# Patient Record
Sex: Female | Born: 1935 | Race: Black or African American | Hispanic: No | Smoking: Never smoker
Health system: Southern US, Community
[De-identification: ages and names within clinical notes are randomized; demographics above are authoritative.]

## PROBLEM LIST (undated history)

## (undated) DIAGNOSIS — M8580 Other specified disorders of bone density and structure, unspecified site: Secondary | ICD-10-CM

## (undated) DIAGNOSIS — M7061 Trochanteric bursitis, right hip: Secondary | ICD-10-CM

## (undated) DIAGNOSIS — E041 Nontoxic single thyroid nodule: Secondary | ICD-10-CM

## (undated) DIAGNOSIS — I5020 Unspecified systolic (congestive) heart failure: Secondary | ICD-10-CM

## (undated) DIAGNOSIS — I5189 Other ill-defined heart diseases: Secondary | ICD-10-CM

## (undated) DIAGNOSIS — M329 Systemic lupus erythematosus, unspecified: Secondary | ICD-10-CM

## (undated) DIAGNOSIS — M1710 Unilateral primary osteoarthritis, unspecified knee: Secondary | ICD-10-CM

## (undated) DIAGNOSIS — I272 Pulmonary hypertension, unspecified: Secondary | ICD-10-CM

## (undated) DIAGNOSIS — E538 Deficiency of other specified B group vitamins: Secondary | ICD-10-CM

## (undated) DIAGNOSIS — E559 Vitamin D deficiency, unspecified: Secondary | ICD-10-CM

## (undated) DIAGNOSIS — K649 Unspecified hemorrhoids: Secondary | ICD-10-CM

## (undated) DIAGNOSIS — I2510 Atherosclerotic heart disease of native coronary artery without angina pectoris: Secondary | ICD-10-CM

## (undated) DIAGNOSIS — M1990 Unspecified osteoarthritis, unspecified site: Secondary | ICD-10-CM

## (undated) DIAGNOSIS — E876 Hypokalemia: Secondary | ICD-10-CM

## (undated) DIAGNOSIS — E785 Hyperlipidemia, unspecified: Secondary | ICD-10-CM

## (undated) DIAGNOSIS — D649 Anemia, unspecified: Secondary | ICD-10-CM

## (undated) DIAGNOSIS — M179 Osteoarthritis of knee, unspecified: Secondary | ICD-10-CM

## (undated) DIAGNOSIS — I10 Essential (primary) hypertension: Secondary | ICD-10-CM

## (undated) HISTORY — DX: Hyperlipidemia, unspecified: E78.5

## (undated) HISTORY — PX: COLONOSCOPY: SHX174

## (undated) HISTORY — DX: Essential (primary) hypertension: I10

## (undated) HISTORY — DX: Osteoarthritis of knee, unspecified: M17.9

## (undated) HISTORY — PX: TONSILLECTOMY: SUR1361

## (undated) HISTORY — PX: TUBAL LIGATION: SHX77

## (undated) HISTORY — DX: Unilateral primary osteoarthritis, unspecified knee: M17.10

---

## 2008-04-06 ENCOUNTER — Encounter: Payer: Self-pay | Admitting: Family Medicine

## 2009-09-24 ENCOUNTER — Encounter: Admission: RE | Admit: 2009-09-24 | Discharge: 2009-09-24 | Payer: Self-pay | Admitting: Family Medicine

## 2009-09-24 ENCOUNTER — Ambulatory Visit: Payer: Self-pay | Admitting: Family Medicine

## 2009-09-24 DIAGNOSIS — I10 Essential (primary) hypertension: Secondary | ICD-10-CM | POA: Insufficient documentation

## 2009-09-24 DIAGNOSIS — M1710 Unilateral primary osteoarthritis, unspecified knee: Secondary | ICD-10-CM

## 2009-09-24 DIAGNOSIS — E119 Type 2 diabetes mellitus without complications: Secondary | ICD-10-CM | POA: Insufficient documentation

## 2009-11-07 ENCOUNTER — Ambulatory Visit: Payer: Self-pay | Admitting: Family Medicine

## 2009-11-07 LAB — CONVERTED CEMR LAB: Hgb A1c MFr Bld: 6 %

## 2009-11-13 ENCOUNTER — Telehealth (INDEPENDENT_AMBULATORY_CARE_PROVIDER_SITE_OTHER): Payer: Self-pay | Admitting: *Deleted

## 2009-11-28 ENCOUNTER — Ambulatory Visit: Payer: Self-pay | Admitting: Family Medicine

## 2009-12-20 ENCOUNTER — Ambulatory Visit: Payer: Self-pay | Admitting: Family Medicine

## 2010-01-14 ENCOUNTER — Encounter: Payer: Self-pay | Admitting: Family Medicine

## 2010-01-15 LAB — CONVERTED CEMR LAB
ALT: 15 units/L (ref 0–35)
Alkaline Phosphatase: 67 units/L (ref 39–117)
CO2: 30 meq/L (ref 19–32)
Cholesterol: 238 mg/dL — ABNORMAL HIGH (ref 0–200)
Creatinine, Ser: 0.68 mg/dL (ref 0.40–1.20)
LDL Cholesterol: 154 mg/dL — ABNORMAL HIGH (ref 0–99)
Sodium: 140 meq/L (ref 135–145)
Total Bilirubin: 0.9 mg/dL (ref 0.3–1.2)
Total CHOL/HDL Ratio: 3.6
Total Protein: 6.9 g/dL (ref 6.0–8.3)
VLDL: 17 mg/dL (ref 0–40)

## 2010-03-06 ENCOUNTER — Ambulatory Visit: Payer: Self-pay | Admitting: Family Medicine

## 2010-03-06 DIAGNOSIS — E785 Hyperlipidemia, unspecified: Secondary | ICD-10-CM

## 2010-05-22 ENCOUNTER — Encounter: Payer: Self-pay | Admitting: Family Medicine

## 2010-05-23 LAB — CONVERTED CEMR LAB
Creatinine, Urine: 107.2 mg/dL
HDL: 71 mg/dL (ref >39)
Hgb A1c MFr Bld: 6 % — ABNORMAL HIGH (ref <5.7)
LDL Cholesterol: 139 mg/dL — ABNORMAL HIGH (ref 0–99)
Microalb Creat Ratio: 15.4 mg/g (ref 0.0–30.0)
Total CHOL/HDL Ratio: 3.2

## 2010-08-13 ENCOUNTER — Ambulatory Visit: Payer: Self-pay | Admitting: Family Medicine

## 2010-08-13 ENCOUNTER — Telehealth: Payer: Self-pay | Admitting: Family Medicine

## 2010-08-13 DIAGNOSIS — E669 Obesity, unspecified: Secondary | ICD-10-CM

## 2010-09-05 ENCOUNTER — Telehealth (INDEPENDENT_AMBULATORY_CARE_PROVIDER_SITE_OTHER): Payer: Self-pay | Admitting: *Deleted

## 2010-11-05 NOTE — Assessment & Plan Note (Signed)
Summary: BP/DM   Vital Signs:  Patient profile:   75 year old female Height:      63 inches Weight:      196 pounds BMI:     34.85 Pulse rate:   74 / minute BP sitting:   136 / 74  Vitals Entered By: Kandice Hams (November 07, 2009 9:30 AM) CC: 6 wl followup   Primary Care Provider:  Seymour Bars DO  CC:  6 wl followup.  History of Present Illness: 75 yo AAF presents for DM f/u, HTN and DJD of the knees.  Her knee pain has improved with use of MSM and gin soaked raisins.  She declined ortho referral b/c she did not want joint injections.  She is still exercising.  Denies knee swelling.  Due for for A1C today.  Her AM fastings are running 110s.  She does not take any diabetes meds.  She is taking several BP mes including an ARB + ACEi that her last doctor put her on.  Denies CP, DOE, leg swelling.    Allergies: No Known Drug Allergies  Past History:  Past Medical History: Reviewed history from 09/24/2009 and no changes required. DM 2006 (off meds) HTN high chol (off meds) knee DJD  Past Surgical History: Reviewed history from 09/24/2009 and no changes required. tonsillectomy BTL  Social History: Reviewed history from 09/24/2009 and no changes required. 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 Moved from [facility] in 07-2009. Daughter lives here.  son in [place].  son in [place].  Has 7 grandkids. Never smoked.  Denies ETOH. Does exercise.  Review of Systems      See HPI  Physical Exam  General:  alert, well-developed, well-nourished, well-hydrated, and overweight-appearing.   Head:  normocephalic and atraumatic.   Eyes:  pupils equal, pupils round, and pupils reactive to light.   Nose:  no nasal discharge.   Mouth:  pharynx pink and moist.   Neck:  no masses.   Lungs:  Normal respiratory effort, chest expands symmetrically. Lungs are clear to auscultation, no crackles or wheezes. Heart:  Normal rate and regular rhythm. S1 and S2 normal without gallop,  murmur, click, rub or other extra sounds. Extremities:  trace bilat ankle edema, non pitting Skin:  color normal.   Psych:  good eye contact, not anxious appearing, and not depressed appearing.     Impression & Recommendations:  Problem # 1:  ESSENTIAL HYPERTENSION, BENIGN (ICD-401.1) Changed BP meds to just 1 tab of Tribenzor daily.  Repeat at a nurse visit in 3 wks.   The following medications were removed from the medication list:    Avapro 300 Mg Tabs (Irbesartan) .Marland Kitchen... 1 tab by mouth daily    Amlodipine Besylate 5 Mg Tabs (Amlodipine besylate) .Marland Kitchen... Take 1 tab by mouth once daily    Lisinopril 20 Mg Tabs (Lisinopril) .Marland Kitchen... 1 tab by mouth daily    Hydrochlorothiazide 25 Mg Tabs (Hydrochlorothiazide) .Marland Kitchen... 1 tab by mouth daily Her updated medication list for this problem includes:    Tribenzor 40-5-25 Mg Tabs (Olmesartan-amlodipine-hctz) .Marland Kitchen... 1 tab by mouth daily  BP today: 136/74 Prior BP: 134/72 (09/24/2009)  Problem # 2:  DM (ICD-250.00)  She is off meds for DM and has an A1C of 6 -- good.  Continue diet and exercise for treatment.   The following medications were removed from the medication list:    Avapro 300 Mg Tabs (Irbesartan) .Marland Kitchen... 1 tab by mouth daily    Lisinopril  20 Mg Tabs (Lisinopril) .Marland Kitchen... 1 tab by mouth daily Her updated medication list for this problem includes:    Tribenzor 40-5-25 Mg Tabs (Olmesartan-amlodipine-hctz) .Marland Kitchen... 1 tab by mouth daily  Orders: Fingerstick (36416) Hgb A1C (44010UV)  Problem # 3:  DEGENERATIVE JOINT DISEASE, KNEES, BILATERAL (ICD-715.96) Assessment: Improved Continue current treatment plan. Her updated medication list for this problem includes:    Ibuprofen 800 Mg Tabs (Ibuprofen) .Marland Kitchen... Take 1 tab by mouth three times a day  as needed pain  Complete Medication List: 1)  Ibuprofen 800 Mg Tabs (Ibuprofen) .... Take 1 tab by mouth three times a day  as needed pain 2)  Tribenzor 40-5-25 Mg Tabs (Olmesartan-amlodipine-hctz) .Marland Kitchen.. 1  tab by mouth daily  Patient Instructions: 1)  Change BP meds to [facility] 1 tablet by mouth each morning. 2)  Keep working on [employer] and regular exercise. 3)  A1C looks great at 6.0. 4)  REturn for a nurse BP check in 3 wks. Prescriptions: TRIBENZOR 40-5-25 MG TABS (OLMESARTAN-AMLODIPINE-HCTZ) 1 tab by mouth daily  #90 x 0   Entered and Authorized by:   Seymour Bars DO   Signed by:   Seymour Bars DO on 11/07/2009   Method used:   Print then Give to Patient   RxID:   [id number]   Laboratory Results   Blood Tests     HGBA1C: 6.0%   (Normal Range: Non-Diabetic - 3-6%   Control Diabetic - 6-8%)

## 2010-11-05 NOTE — Assessment & Plan Note (Signed)
Summary: f/u IFG/ HTN/ Chol   Vital Signs:  Patient profile:   75 year old female Height:      63 inches Weight:      195 pounds BMI:     34.67 O2 Sat:      99 % on Room air Pulse rate:   66 / minute BP sitting:   150 / 78  (left arm) Cuff size:   large  Vitals Entered By: Payton Spark CMA (August 13, 2010 9:07 AM)  O2 Flow:  Room air CC: F/U HTN and DM. Too early for A1C.   Primary Care Provider:  Seymour Bars DO  CC:  F/U HTN and DM. Too early for A1C.Theresa [name]  History of Present Illness: 75 yo AAF presents for f/u hyperlipidemia, HTN and DM.  She refused to take the Pravastatin because she is afraid and continues to refuse to take them.    She is on HCTZ, Avapro and Norvasc but her BPs even at home are still running 140s/80s.  She lost 5 lbs and has been exercising more and eating more fruits and veggies.  Her last A1C in Aug.  She is diet controlled.  Her AM fastings are running <100.    Denies CP, DOE, leg swelling or heart palptiations.      Allergies: No Known Drug Allergies  Past History:  Past Medical History: DM 2006 (off meds)-- vs IFG HTN high chol (off meds) knee DJD  Past Surgical History: Reviewed history from 09/24/2009 and no changes required. tonsillectomy BTL  Social History: Reviewed history from 09/24/2009 and no changes required. 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 Moved from [facility] in 07-[facility]. Daughter lives here.  son in [place].  son in [place].  Has 7 grandkids. Never smoked.  Denies ETOH. Does exercise.  Review of Systems      See HPI  Physical Exam  General:  alert, well-developed, well-nourished, and well-hydrated.  obese Head:  normocephalic and atraumatic.   Eyes:  pupils equal, pupils round, and pupils reactive to light.  wears glasses Mouth:  pharynx pink and moist.   Neck:  no masses.   Lungs:  Normal respiratory effort, chest expands symmetrically. Lungs are clear to auscultation, no crackles or  wheezes. Heart:  RRR with 1/6 systolic mumur Pulses:  2+ radial pulses Extremities:  trace ankle edema bilat, non pitting Skin:  color normal.   Cervical Nodes:  No lymphadenopathy noted Psych:  good eye contact, not anxious appearing, and not depressed appearing.     Impression & Recommendations:  Problem # 1:  ESSENTIAL HYPERTENSION, BENIGN (ICD-401.1) BP high today even with medication compliance.  Her home BP readings are even high with wt loss, healthier diet and more exercise.  I am going to add Metoprolol daily.  Call with home BP readings in 1 month or if any problems.  o/w f/u in 3 mos. Her updated medication list for this problem includes:    Norvasc 5 Mg Tabs (Amlodipine besylate) .Theresa [name]... 1 tab by mouth daily    Hydrochlorothiazide 25 Mg Tabs (Hydrochlorothiazide) .Theresa [name]... Take 1 tab by mouth once daily    Avapro 300 Mg Tabs (Irbesartan) .Theresa [name]... 1 tab by mouth daily    Metoprolol Succinate 50 Mg Xr24h-tab (Metoprolol succinate) .Theresa [name]... 1 tab by mouth daily  BP today: 150/78 Prior BP: 127/78 (03/06/2010)  Prior 10 Yr Risk Heart Disease: Not enough information (12/20/2009)  Labs Reviewed: K+: 3.4 (01/14/2010) Creat: : 0.68 (01/14/2010)   Chol: 226 (  05/22/2010)   HDL: 71 (05/22/2010)   LDL: 139 (05/22/2010)   TG: 78 (05/22/2010)  Problem # 2:  DM (ICD-250.00) IFG vs DM, diet controlled.  Last A1C 6 c/w IFG.  She can check AM fastings 2-3 x a wk and should continue to work on wt loss measures, low sugar / low carb diet.  Plan to do 2 hr OGTT in 2012. Her updated medication list for this problem includes:    Avapro 300 Mg Tabs (Irbesartan) .Theresa [name]... 1 tab by mouth daily  Problem # 3:  HYPERLIPIDEMIA (ICD-272.4) Pt refused to take statin (never did try it).  I discussed her risk for CVD from not taking it and she still refused to take it.  Her LDL goal is <100.  She is going to up her Fish Oil to 4 g/ day and keep working on wt loss.  Plan to recheck in 6 mos. Labs Reviewed: SGOT: 17  (01/14/2010)   SGPT: 15 (01/14/2010)  Prior 10 Yr Risk Heart Disease: Not enough information (12/20/2009)   HDL:71 (05/22/2010), 67 (01/14/2010)  LDL:139 (05/22/2010), 154 (01/14/2010)  Chol:226 (05/22/2010), 238 (01/14/2010)  Trig:78 (05/22/2010), 87 (01/14/2010)  Problem # 4:  OBESITY, UNSPECIFIED (ICD-278.00) BMI 34 c/w class I obesity.  She has lost 5 lbs which is a good start.  We discussed goal for diet and exercise.    Complete Medication List: 1)  Ibuprofen 800 Mg Tabs (Ibuprofen) .... Take 1 tab by mouth three times a day  as needed pain 2)  Norvasc 5 Mg Tabs (Amlodipine besylate) .Theresa [name].. 1 tab by mouth daily 3)  Hydrochlorothiazide 25 Mg Tabs (Hydrochlorothiazide) .... Take 1 tab by mouth once daily 4)  Avapro 300 Mg Tabs (Irbesartan) .Theresa [name].. 1 tab by mouth daily 5)  Metoprolol Succinate 50 Mg Xr24h-tab (Metoprolol succinate) .Theresa [name].. 1 tab by mouth daily 6)  One Touch Ultra Test Strips  .... Use only daily as directed  Patient Instructions: 1)  Add Metoprolol once daily for high BP. 2)  Monitor at home. 3)  Goal is <140/90. 4)  Add 4 grams of Omega 3 Fish Oil daily for lipids. 5)  Continue regular exercise, healthy - low sugar/ low carb diet. 6)  REturn for a PHYSICAL in 3 months.   Prescriptions: ONE TOUCH ULTRA TEST STRIPS use only daily as directed  #2 boxes x 3   Entered and Authorized by:   Seymour Bars DO   Signed by:   Seymour Bars DO on 08/13/2010   Method used:   Printed then faxed to ...       [facility] (mail-order)             , [place]         Ph: [phone]       Fax: [phone]   RxID:   [id number] METOPROLOL SUCCINATE 50 MG XR24H-TAB (METOPROLOL SUCCINATE) 1 tab by mouth daily  #90 x 1   Entered and Authorized by:   Seymour Bars DO   Signed by:   Seymour Bars DO on 08/13/2010   Method used:   Printed then faxed to ...       [facility] (mail-order)             , [place]         Ph: [phone]       Fax: [phone]   RxID:    [id number] METOPROLOL SUCCINATE 50 MG XR24H-TAB (METOPROLOL SUCCINATE) 1 tab by mouth daily  #30 x 0  Entered and Authorized by:   Seymour Bars DO   Signed by:   Seymour Bars DO on 08/13/2010   Method used:   Electronically to        [facility] (retail)       [address]       [place], [place]  [address]       Ph: [phone]       Fax: [phone]   RxID:   [id number]    Orders Added: 1)  Est. Patient Level IV [29518]

## 2010-11-05 NOTE — Assessment & Plan Note (Signed)
Summary: f/u BP/ labs   Vital Signs:  Patient profile:   75 year old female Height:      63 inches Weight:      200 pounds BMI:     35.56 O2 Sat:      97 % on Room air Pulse rate:   84 / minute BP sitting:   127 / 78  (left arm) Cuff size:   large  Vitals Entered By: Payton Spark CMA (March 06, 2010 1:29 PM)  O2 Flow:  Room air CC: F/U HTN. Too early for A1C   Primary Care Provider:  Seymour Bars DO  CC:  F/U HTN. Too early for A1C.  History of Present Illness: 75 yo AAF presents for f/u HTN.  She is doing well on HCTZ, Norvasc and Avapro.  Doing well on these.  She is taking an OTC K+ supplement daily.  She has been working on [employer] and more exercise.  Her last LDL was high in April but she wanted to try Omega 3 Fish oil and diet/ exercise.  She is not taking anything for her blood sugar.  She is not due for her A1C yet.    Denies CP or DOE.    Current Medications (verified): 1)  Ibuprofen 800 Mg Tabs (Ibuprofen) .... Take 1 Tab By Mouth Three Times A Day  As Needed Pain 2)  Norvasc 5 Mg Tabs (Amlodipine Besylate) .Marland Kitchen.. 1 Tab By Mouth Daily 3)  Hydrochlorothiazide 25 Mg Tabs (Hydrochlorothiazide) .... Take 1 Tab By Mouth Once Daily 4)  Avapro 300 Mg Tabs (Irbesartan) .Marland Kitchen.. 1 Tab By Mouth Daily  Allergies (verified): No Known Drug Allergies  Past History:  Past Medical History: Reviewed history from 09/24/2009 and no changes required. DM 2006 (off meds) HTN high chol (off meds) knee DJD  Social History: Reviewed history from 09/24/2009 and no changes required. 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 Moved from [facility] in 07-2009. Daughter lives here.  son in [place].  son in [place].  Has 7 grandkids. Never smoked.  Denies ETOH. Does exercise.  Review of Systems      See HPI  Physical Exam  General:  obese AAF in NAD Head:  normocephalic and atraumatic.   Eyes:  pupils equal, pupils round, and pupils reactive to light.  wears glasses Nose:  no  nasal discharge.   Mouth:  pharynx pink and moist.   Neck:  no masses.   Lungs:  Normal respiratory effort, chest expands symmetrically. Lungs are clear to auscultation, no crackles or wheezes. Heart:  RRR with 1/6 systolic mumur Extremities:  no LE edema Skin:  color normal.   Cervical Nodes:  No lymphadenopathy noted Psych:  good eye contact, not anxious appearing, and not depressed appearing.     Impression & Recommendations:  Problem # 1:  ESSENTIAL HYPERTENSION, BENIGN (ICD-401.1) BP at goal on current meds.  Her updated medication list for this problem includes:    Norvasc 5 Mg Tabs (Amlodipine besylate) .Marland Kitchen... 1 tab by mouth daily    Hydrochlorothiazide 25 Mg Tabs (Hydrochlorothiazide) .Marland Kitchen... Take 1 tab by mouth once daily    Avapro 300 Mg Tabs (Irbesartan) .Marland Kitchen... 1 tab by mouth daily  BP today: 127/78 Prior BP: 136/73 (12/20/2009)  Prior 10 Yr Risk Heart Disease: Not enough information (12/20/2009)  Labs Reviewed: K+: 3.4 (01/14/2010) Creat: : 0.68 (01/14/2010)   Chol: 238 (01/14/2010)   HDL: 67 (01/14/2010)   LDL: 154 (01/14/2010)   TG: 87 (  01/14/2010)  Problem # 2:  HYPERLIPIDEMIA (ICD-272.4) Update FLP in 8 wks after her trial of diet, exercise, fish oil.  I have c/w her that her LDL goal is <100 and is will be close to impossible for her to get to goal w/o a statin.  She refuses this and understands the risk including death of non adherence. Orders: T-Lipid Profile [phone])  Labs Reviewed: SGOT: 17 (01/14/2010)   SGPT: 15 (01/14/2010)  Prior 10 Yr Risk Heart Disease: Not enough information (12/20/2009)   HDL:67 (01/14/2010)  LDL:154 (01/14/2010)  Chol:238 (01/14/2010)  Trig:87 (01/14/2010)  Problem # 3:  DM (ICD-250.00) She likely has more IFG than T2DM.  Will get her A1C (off meds) in 2 mos.  She still needs to work on healthy diet, exercise and wt loss. Her updated medication list for this problem includes:    Avapro 300 Mg Tabs (Irbesartan) .Marland Kitchen... 1 tab by  mouth daily  Orders: T-Hemoglobin A1C (09811) T-Urine Microalbumin w/creat. ratio [id number])  Complete Medication List: 1)  Ibuprofen 800 Mg Tabs (Ibuprofen) .... Take 1 tab by mouth three times a day  as needed pain 2)  Norvasc 5 Mg Tabs (Amlodipine besylate) .Marland Kitchen.. 1 tab by mouth daily 3)  Hydrochlorothiazide 25 Mg Tabs (Hydrochlorothiazide) .... Take 1 tab by mouth once daily 4)  Avapro 300 Mg Tabs (Irbesartan) .Marland Kitchen.. 1 tab by mouth daily  Patient Instructions: 1)  BP at goal! 2)  Update fasting labs in 2 mos. 3)  Will call you w/ results. 4)  Keep working on [employer], regular exercise, weight loss. 5)  Return for follow up office visit in 4 mos.

## 2010-11-05 NOTE — Assessment & Plan Note (Signed)
Summary: BP  Nurse Visit   Vital Signs:  Patient profile:   75 year old female O2 Sat:      100 % on Room air Pulse rate:   73 / minute BP sitting:   138 / 75  (right arm) Cuff size:   large  Vitals Entered By: Payton Spark CMA (November 28, 2009 10:25 AM)  O2 Flow:  Room air  Allergies: No Known Drug Allergies  Orders Added: 1)  Est. Patient Level I [47425]   CC: Follow-up HTN HPI: Taking meds? yes   Side effects? no Chest pain, SOB, Dizziness? no A/P: HTN (401.1) At goal? If no, Patient will be notified.  5 minutes was spent with patient.   Pt is unable to afford Tribenzor.    Impression & Recommendations:  Problem # 1:  ESSENTIAL HYPERTENSION, BENIGN (ICD-401.1) SBP goal is <130.  Cost an issue:  I would advise pt to have the pharmacy run the savings card as 'self pay' and it should cost her 25 dollars a month.   Her updated medication list for this problem includes:    Tribenzor 40-5-25 Mg Tabs (Olmesartan-amlodipine-hctz) .Marland Kitchen... 1 tab by mouth daily  BP today: 138/75 Prior BP: 136/74 (11/07/2009)  Complete Medication List: 1)  Ibuprofen 800 Mg Tabs (Ibuprofen) .... Take 1 tab by mouth three times a day  as needed pain 2)  Tribenzor 40-5-25 Mg Tabs (Olmesartan-amlodipine-hctz) .Marland Kitchen.. 1 tab by mouth daily   Appended Document: BP pt aware. Pt states that she isn't even able to activate the card bc she has medicare.   Appended Document: BP Pls contact our Tribenzor rep and find out if medicare pts can use it under 'self pay'.  Seymour Bars, D.O.  Appended Document: BP Viviann Spare w/ Tribenzor said that no savings card will work for [employer] w/ medicare part D. Pt not able to use as self pay w/out affecting medicare.   Appended Document: BP I am going to change her to generic norvasc (amlodopine) 5 mg/ day + Lisinopril/HCTZ 20/25 mg / day.   Once off Tribenzor. Return for a nurse BP check after on this regimen for 1 wk.  This should be cheaper.  Seymour Bars,  D.O.  Appended Document: BP LMOM for Pt to CB.Arvilla Market CMA, Michelle November 29, 2009 1:29 PM   pt aware of the above.Arvilla Market CMA, Michelle November 29, 2009 4:30 PM

## 2010-11-05 NOTE — Progress Notes (Signed)
Summary: Med supplies  Phone Note Call from Patient   Summary of Call: Pt LMOM stating we should be getting a fax or call from [employer] for supplies. Pt requested this and would like [place] to reply to them.  Initial call taken by: Payton Spark CMA,  November 13, 2009 4:51 PM

## 2010-11-05 NOTE — Progress Notes (Signed)
Summary: BP med question  Phone Note Call from Patient Call back at Home Phone [phone]   Caller: Patient Call For: Theresa Bars DO Summary of Call: Pt seen today and started her on Metoprolol today- pt wanted to know if could increase the Avapro to 300mg  because she was only taking 1/2 of the 300mg  Avapro rather than adding another medicine. Please advise Initial call taken by: Kathlene November LPN,  August 13, 2010 4:09 PM  Follow-up for Phone Call        this is why it is important to be honest with RX's during office visits.  Have her take a FULL AVAPRO 300 mg daily instead of starting the metoprolol and return for a nurse BP check in 2 wks.  Follow-up by: Theresa Bars DO,  August 13, 2010 4:18 PM     Appended Document: BP med question 08/13/2010 @ 4:19pm- Pt notified of MD instructions. KJ LPN

## 2010-11-05 NOTE — Assessment & Plan Note (Signed)
Summary: f/u HTN   Vital Signs:  Patient profile:   75 year old female Height:      63 inches Weight:      198 pounds BMI:     35.20 O2 Sat:      100 % on Room air Pulse rate:   80 / minute BP sitting:   136 / 73  (left arm) Cuff size:   large  Vitals Entered By: Payton Spark CMA (December 20, 2009 1:09 PM)  O2 Flow:  Room air CC: F/U BP, Hypertension Management   CC:  F/U BP and Hypertension Management.  Hypertension History:      She denies headache, chest pain, palpitations, dyspnea with exertion, peripheral edema, visual symptoms, and side effects from treatment.  She notes no problems with any antihypertensive medication side effects.        Positive major cardiovascular risk factors include female age 47 years old or older, diabetes, and hypertension.     Current Medications (verified): 1)  Ibuprofen 800 Mg Tabs (Ibuprofen) .... Take 1 Tab By Mouth Three Times A Day  As Needed Pain 2)  Norvasc 5 Mg Tabs (Amlodipine Besylate) .Marland Kitchen.. 1 Tab By Mouth Daily 3)  Hydrochlorothiazide 25 Mg Tabs (Hydrochlorothiazide) .... Take 1 Tab By Mouth Once Daily 4)  Avapro 150 Mg Tabs (Irbesartan) .... Take 1 Tab By Mouth Once Daily  Allergies (verified): No Known Drug Allergies  Past History:  Past Medical History: Reviewed history from 09/24/2009 and no changes required. DM 2006 (off meds) HTN high chol (off meds) knee DJD  Past Surgical History: Reviewed history from 09/24/2009 and no changes required. tonsillectomy BTL  Social History: Reviewed history from 09/24/2009 and no changes required. 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 Moved from [facility] in 07-2009. Daughter lives here.  son in [place].  son in [place].  Has 7 grandkids. Never smoked.  Denies ETOH. Does exercise.  Review of Systems      See HPI  Physical Exam  General:  alert, well-developed, well-nourished, well-hydrated, and overweight-appearing.   Head:  normocephalic and atraumatic.   Eyes:   pupils equal, pupils round, and pupils reactive to light.  wears glasses Nose:  no nasal discharge.   Mouth:  good dentition and pharynx pink and moist.   Neck:  no masses.   Lungs:  Normal respiratory effort, chest expands symmetrically. Lungs are clear to auscultation, no crackles or wheezes. Heart:  RRR with 1/6 systolic mumur Extremities:  trace pitting LE edema bilat Skin:  color normal.   Psych:  good eye contact, not anxious appearing, and not depressed appearing.     Impression & Recommendations:  Problem # 1:  ESSENTIAL HYPERTENSION, BENIGN (ICD-401.1) BP improved on current regimen.  Continue.  Update fasting labs.  RTC in 3 mos. The following medications were removed from the medication list:    Lisinopril-hydrochlorothiazide 20-25 Mg Tabs (Lisinopril-hydrochlorothiazide) .Marland Kitchen... 1 tab by mouth qam Her updated medication list for this problem includes:    Norvasc 5 Mg Tabs (Amlodipine besylate) .Marland Kitchen... 1 tab by mouth daily    Hydrochlorothiazide 25 Mg Tabs (Hydrochlorothiazide) .Marland Kitchen... Take 1 tab by mouth once daily    Avapro 300 Mg Tabs (Irbesartan) .Marland Kitchen... 1 tab by mouth daily  Orders: T-Comprehensive Metabolic Panel ([address]14)  BP today: 136/73 Prior BP: 138/75 (11/28/2009)  10 Yr Risk Heart Disease: Not enough information  Problem # 2:  DM (ICD-250.00) Diet controlled.  F/U in 3 mos. The following medications were removed  from the medication list:    Lisinopril-hydrochlorothiazide 20-25 Mg Tabs (Lisinopril-hydrochlorothiazide) .Marland Kitchen... 1 tab by mouth qam Her updated medication list for this problem includes:    Avapro 300 Mg Tabs (Irbesartan) .Marland Kitchen... 1 tab by mouth daily  Reviewed HgBA1c results: 6.0 (11/07/2009)  Complete Medication List: 1)  Ibuprofen 800 Mg Tabs (Ibuprofen) .... Take 1 tab by mouth three times a day  as needed pain 2)  Norvasc 5 Mg Tabs (Amlodipine besylate) .Marland Kitchen.. 1 tab by mouth daily 3)  Hydrochlorothiazide 25 Mg Tabs (Hydrochlorothiazide) .... Take  1 tab by mouth once daily 4)  Avapro 300 Mg Tabs (Irbesartan) .Marland Kitchen.. 1 tab by mouth daily 5)  Amoxicillin 500 Mg Caps (Amoxicillin) .... 4 capsules by mouth x 1 dose 30 min prior to dental procedure  Other Orders: T-Lipid Profile ([id number])  Hypertension Assessment/Plan:      The patient's hypertensive risk group is category C: Target organ damage and/or diabetes.  Today's blood pressure is 136/73.  Her blood pressure goal is < 130/80.  Patient Instructions: 1)  Meds RFd. 2)  Have fasting labs updated. 3)  Make sure annual eye exam is up to date. 4)  Return for f/u diabetes/ HTN in 3 mos. Prescriptions: AMOXICILLIN 500 MG CAPS (AMOXICILLIN) 4 capsules by mouth x 1 dose 30 min prior to dental procedure  #4 capsules x 2   Entered and Authorized by:   Seymour Bars DO   Signed by:   Seymour Bars DO on 12/20/2009   Method used:   Electronically to        [facility] (retail)       [address]       [place], [place]  [address]       Ph: [phone]       Fax: [phone]   RxID:   [id number] HYDROCHLOROTHIAZIDE 25 MG TABS (HYDROCHLOROTHIAZIDE) Take 1 tab by mouth once daily  #90 x 1   Entered and Authorized by:   Seymour Bars DO   Signed by:   Seymour Bars DO on 12/20/2009   Method used:   Print then Give to Patient   RxID:   [id number] AVAPRO 300 MG TABS (IRBESARTAN) 1 tab by mouth daily  #90 x 1   Entered and Authorized by:   Seymour Bars DO   Signed by:   Seymour Bars DO on 12/20/2009   Method used:   Print then Give to Patient   RxID:   [id number] NORVASC 5 MG TABS (AMLODIPINE BESYLATE) 1 tab by mouth daily  #90 x 1   Entered and Authorized by:   Seymour Bars DO   Signed by:   Seymour Bars DO on 12/20/2009   Method used:   Print then Give to Patient   RxID:   [id number]

## 2010-11-05 NOTE — Progress Notes (Signed)
Summary: Rx for HCTZ  Phone Note Call from Patient Call back at Home Phone [phone]   Caller: Patient Call For: Seymour Bars DO Summary of Call: [facility] as yet to receive her refill on the HCTZ- [facility]. Needs [facility] sent [facility] Initial call taken by: Kathlene November LPN,  September 05, 2010 8:56 AM    Prescriptions: NORVASC 5 MG TABS (AMLODIPINE BESYLATE) 1 tab by mouth daily  #90 x 1   Entered by:   Payton Spark CMA   Authorized by:   Seymour Bars DO   Signed by:   Payton Spark CMA on 09/05/2010   Method used:   Faxed to ...       [facility] (mail[facility]order)       [address]       [place], [place]  [id number]       Ph: [phone]       Fax: [phone]   RxID:   [id number] HYDROCHLOROTHIAZIDE 25 MG TABS (HYDROCHLOROTHIAZIDE) Take 1 tab by mouth once daily  #90 x 1   Entered by:   Payton Spark CMA   Authorized by:   Seymour Bars DO   Signed by:   Payton Spark CMA on 09/05/2010   Method used:   Faxed to ...       [facility] (mail-order)       [address]       [place], [place]  [id number]       Ph: [phone]       Fax: [phone]   RxID:   [id number]

## 2010-11-13 ENCOUNTER — Encounter (INDEPENDENT_AMBULATORY_CARE_PROVIDER_SITE_OTHER): Payer: Medicare HMO | Admitting: Family Medicine

## 2010-11-13 ENCOUNTER — Encounter: Payer: Self-pay | Admitting: Family Medicine

## 2010-11-13 DIAGNOSIS — Z23 Encounter for immunization: Secondary | ICD-10-CM

## 2010-11-13 DIAGNOSIS — Z0000 Encounter for general adult medical examination without abnormal findings: Secondary | ICD-10-CM

## 2010-11-13 DIAGNOSIS — I10 Essential (primary) hypertension: Secondary | ICD-10-CM

## 2010-11-13 DIAGNOSIS — Z780 Asymptomatic menopausal state: Secondary | ICD-10-CM | POA: Insufficient documentation

## 2010-11-14 ENCOUNTER — Encounter (INDEPENDENT_AMBULATORY_CARE_PROVIDER_SITE_OTHER): Payer: Self-pay | Admitting: *Deleted

## 2010-11-14 ENCOUNTER — Encounter: Payer: Self-pay | Admitting: Family Medicine

## 2010-11-21 NOTE — Letter (Signed)
Summary: Primary Care Consult Scheduled Letter  [place] [facility]  [address]   [place], [place] [address]   Phone: [phone]  Fax: [phone]      11/14/2010 MRN: [id number]  [facility] [address] [place], [place]  [address]  [place]    Dear Ms. Dula,      We have scheduled an appointment for you.  At the recommendation of Dr.Ryen Rhames, we have scheduled a colonoscopy with [facility] on December 27, 2010. Please arrive at 8:30. We have also scheduled a nurse visit on December 11, 2010 at 1:00. Please take current medications and your insurance card to your appointment. Their address is [address] [place], [place], [place]. The office phone number is [phone].  If this appointment day and time is not convenient for you, please feel free to call the office of the doctor you are being referred to at the number listed above and reschedule the appointment.     It is important for you to keep your scheduled appointments. We are here to make sure you are given good patient care. If you have questions or you have made changes to your appointment, please notify us at  [phone], ask for [facility].    Thank you,  Patient Care Coordinator [place] [facility]

## 2010-11-21 NOTE — Letter (Signed)
Summary: Pre Visit Letter Revised  [facility]  [address] [place], [place] [address]   Phone: [phone]  Fax: [phone]        11/14/2010 MRN: [id number] [facility] [address] [place], [place]  [address]  [place]             Procedure Date:  December 25, 2010   dir col -Dr Nedra Hai to the Gastroenterology Division at [facility].    You are scheduled to see a nurse for your pre-procedure visit on December 11, 2010 at 1:00pm on the 3rd floor at [facility], [address]  We ask that you try to arrive at our office 15 minutes prior to your appointment time to allow for check-in.  Please take a minute to review the attached form.  If you answer "Yes" to one or more of the questions on the first page, we ask that you call the person listed at your earliest opportunity.  If you answer "No" to all of the questions, please complete the rest of the form and bring it to your appointment.    Your nurse visit will consist of discussing your medical and surgical history, your immediate family medical history, and your medications.   If you are unable to list all of your medications on the form, please bring the medication bottles to your appointment and we will list them.  We will need to be aware of both prescribed and over the counter drugs.  We will need to know exact dosage information as well.    Please be prepared to read and sign documents such as consent forms, a financial agreement, and acknowledgement forms.  If necessary, and with your consent, a friend or relative is welcome to sit-in on the nurse visit with you.  Please bring your insurance card so that we may make a copy of it.  If your insurance requires a referral to see a specialist, please bring your referral form from your primary care physician.  No co-pay is required for this nurse visit.     If you cannot keep your appointment, please call [phone] to cancel or reschedule  prior to your appointment date.  This allows [place] the opportunity to schedule an appointment for another patient in need of care.    Thank you for choosing [facility] for your medical needs.  We appreciate the opportunity to care for you.  Please visit [place] at our website  to learn more about our practice.  Sincerely, The Gastroenterology Division

## 2010-11-21 NOTE — Assessment & Plan Note (Signed)
Summary: CPE   Vital Signs:  Patient profile:   75 year old female Height:      63 inches Weight:      197 pounds BMI:     35.02 O2 Sat:      100 % on Room air Pulse rate:   70 / minute BP sitting:   147 / 83  (left arm) Cuff size:   large  Vitals Entered By: Payton Spark CMA (November 13, 2010 8:26 AM)  O2 Flow:  Room air CC: CPE   Primary Care Provider:  Seymour Bars DO  CC:  CPE.  History of Present Illness: 75 yo WF presents for CPE.  She is doing well.  She has not been taking her Metoprolol but is taking everything else.  Her Home BPs are < 140/90.  She refused to take cholesterol lowering meds though her LDL was in the 140s in August.  She is due for her tetanus update and is interested in getting Zostavax.  Had Pneumovax after age 33. Last colonoscopy was 2001 and is due to repeat.  Last done in [place].  Has chronic constipation.  Due for mammogram and DEXA.  Last DEXA, normal in 09, done in [place]. Reports having a normal nuclear stress test and echo about 2 yr ago in [place] and denies CP or DOE.  Denies fam hx of premature heart dz.  She is exercising about 3 days/ wk and trying to eat healthy but wt is unchanged.  She carries the dx of T2DM but off meds her last A1C was 6, c/w IFG.  She is agreeable to doing a 2 hr OGTT.    Current Medications (verified): 1)  Ibuprofen 800 Mg Tabs (Ibuprofen) .... Take 1 Tab By Mouth Three Times A Day  As Needed Pain 2)  Norvasc 5 Mg Tabs (Amlodipine Besylate) .Marland Kitchen.. 1 Tab By Mouth Daily 3)  Hydrochlorothiazide 25 Mg Tabs (Hydrochlorothiazide) .... Take 1 Tab By Mouth Once Daily 4)  Avapro 300 Mg Tabs (Irbesartan) .Marland Kitchen.. 1 Tab By Mouth Daily 5)  Metoprolol Succinate 50 Mg Xr24h-Tab (Metoprolol Succinate) .Marland Kitchen.. 1 Tab By Mouth Daily 6)  One Touch Ultra Test Strips .... Use Only Daily As Directed  Allergies (verified): No Known Drug Allergies  Past History:  Past Medical History: DM 2006 (off meds)-- vs IFG HTN high chol (off  meds) knee DJD hyperlipidemia, refuses treatment  Past Surgical History: Reviewed history from 09/24/2009 and no changes required. tonsillectomy BTL  Family History: Reviewed history from 09/24/2009 and no changes required. mother died from anesthesia father burned 5 living sibblings 2 brothers died from AMIs > 31 yo. brother DM  Social History: Reviewed history from 09/24/2009 and no changes required. 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 Moved from [facility] in 07-2009. Daughter lives here.  son in [place].  son in [place].  Has 7 grandkids. Never smoked.  Denies ETOH. Does exercise.  Review of Systems  The patient denies anorexia, fever, weight loss, weight gain, vision loss, decreased hearing, hoarseness, chest pain, syncope, dyspnea on exertion, peripheral edema, prolonged cough, headaches, hemoptysis, abdominal pain, melena, hematochezia, severe indigestion/heartburn, hematuria, incontinence, genital sores, muscle weakness, suspicious skin lesions, transient blindness, difficulty walking, depression, unusual weight change, abnormal bleeding, enlarged lymph nodes, angioedema, breast masses, and testicular masses.    Physical Exam  General:  alert, well-developed, well-nourished, and well-hydrated.  obese Head:  normocephalic and atraumatic.   Eyes:  pupils equal, pupils round, and pupils reactive to light.  wears glasses Ears:  EACs patent; TMs translucent and gray with good cone of light and bony landmarks.  Nose:  boggy nasal turbinates, no rhinorrhea Mouth:  good dentition and pharynx pink and moist.   Neck:  no masses.  no audible carotid bruits Breasts:  No mass, nodules, thickening, tenderness, bulging, retraction, inflamation, nipple discharge or skin changes noted.   Lungs:  Normal respiratory effort, chest expands symmetrically. Lungs are clear to auscultation, no crackles or wheezes. Heart:  Normal rate and regular rhythm. S1 and S2 normal without gallop,  murmur, click, rub or other extra sounds. Abdomen:  soft, non-tender, normal bowel sounds, no distention, no masses, no guarding, no hepatomegaly, and no splenomegaly.  no AA bruits Msk:  no joint tenderness, no joint swelling, and no joint warmth.   Pulses:  2+ radial and pedal pulses Extremities:  trace LE edema bilat, non pitting. Neurologic:  gait normal.   Skin:  color normal and no suspicious lesions.   Cervical Nodes:  No lymphadenopathy noted Psych:  good eye contact, not anxious appearing, and not depressed appearing.     Impression & Recommendations:  Problem # 1:  HEALTH MAINTENANCE EXAM (ICD-V70.0) Keeping healthy checklist for women reviewed. BP high due to med non adherence.  Reviewed list w/ pt today. Not due for paps - age >41, not sexually active, no hx of dysplasia, no bleeding or discharge. Tdap updated today.  Labs are UTD.  PNX is UTD. Cardiac testing done 2 yrs ago, normal.  EKG today. Mammo and DEXA due, ordered. Colonoscopy due, ordered. BMI high 35 c/w class II obesity.  Working on [employer], regular exercise, wt loss. Takes MVI and calcium with D daily. EKG sinus brady at 57 bpm, PR increased at 204 ms, T wave flattening in AVL and V2 (lateral leads). Asymptomatic with hx of 'normal nuclear test 2 yrs ago' in [place].  Unfortunately, I do not have an old one to compare to.  Complete Medication List: 1)  Norvasc 5 Mg Tabs (Amlodipine besylate) .Marland Kitchen.. 1 tab by mouth daily 2)  Hydrochlorothiazide 25 Mg Tabs (Hydrochlorothiazide) .... Take 1 tab by mouth once daily 3)  Avapro 300 Mg Tabs (Irbesartan) .Marland Kitchen.. 1 tab by mouth daily 4)  Metoprolol Succinate 50 Mg Xr24h-tab (Metoprolol succinate) .Marland Kitchen.. 1 tab by mouth daily 5)  One Touch Ultra Test Strips  .... Use only daily as directed 6)  Zostavax 29562 Unt/0.40ml Solr (Zoster vaccine live) .... Administer x 1 7)  Fluticasone Propionate 50 Mcg/act Susp (Fluticasone propionate) .... 2 sprays/ nostril once daily  Other  Orders: T-Mammography Bilateral Screening (13086) T-DXA Bone Density/ Appendicular (57846) T-Dual DXA Bone Density/ Axial (96295) Gastroenterology Referral (GI) T-Glucose Tolerance, 1 hr (28413-24401) EKG w/ Interpretation (93000) Tdap => 7yrs IM (02725) Admin 1st Vaccine ([address])  Patient Instructions: 1)  Change Sudafed to Flonase for allergic nasal congestion. 2)  Take all meds listed below. 3)  Continue healthy diet, regular exercise. 4)  Update 2 hr sugar test downstairs in [facility]. 5)  Return for f/u sugar/ BP in 3 mos. Prescriptions: FLUTICASONE PROPIONATE 50 MCG/ACT SUSP (FLUTICASONE PROPIONATE) 2 sprays/ nostril once daily  #1 bottle x 3   Entered and Authorized by:   Seymour Bars DO   Signed by:   Seymour Bars DO on 11/13/2010   Method used:   Electronically to        [facility] (retail)       [address]  [place], [place]  [address]       Ph: [phone]       Fax: [phone]   RxID:   [id number] ZOSTAVAX 19400 UNT/0.65ML SOLR (ZOSTER VACCINE LIVE) administer x 1  #1 x 0   Entered and Authorized by:   Seymour Bars DO   Signed by:   Seymour Bars DO on 11/13/2010   Method used:   Printed then faxed to ...       [facility] (retail)       [address]       [place], [place]  [address]       Ph: [phone]       Fax: [phone]   RxID:   [id number]    Orders Added: 1)  T-Mammography Bilateral Screening [77057] 2)  T-DXA Bone Density/ Appendicular [77081] 3)  T-Dual DXA Bone Density/ Axial [77080] 4)  Gastroenterology Referral [GI] 5)  T-Glucose Tolerance, 1 hr [82950-24000] 6)  Est. Patient age 22&> [88416] 7)  EKG w/ Interpretation [93000] 8)  Tdap => 67yrs IM [90715] 9)  Admin 1st Vaccine [60630]   Immunizations Administered:  Tetanus Vaccine:    Vaccine Type: Tdap    Site: left deltoid    Dose: 0.5 ml    Route: IM    Given by: Payton Spark CMA    Exp.  Date: 07/25/2012    Lot #: [id number]    VIS given: 08/23/08 version given November 13, 2010.   Immunizations Administered:  Tetanus Vaccine:    Vaccine Type: Tdap    Site: left deltoid    Dose: 0.5 ml    Route: IM    Given by: Payton Spark CMA    Exp. Date: 07/25/2012    Lot #: [id number]    VIS given: 08/23/08 version given November 13, 2010.

## 2010-11-26 ENCOUNTER — Other Ambulatory Visit: Payer: Self-pay | Admitting: Family Medicine

## 2010-11-26 ENCOUNTER — Ambulatory Visit
Admission: RE | Admit: 2010-11-26 | Discharge: 2010-11-26 | Disposition: A | Discharge status: Home / Self Care | Payer: Medicare HMO | Source: Ambulatory Visit | Attending: Family Medicine | Admitting: Family Medicine

## 2010-11-26 DIAGNOSIS — Z1231 Encounter for screening mammogram for malignant neoplasm of breast: Secondary | ICD-10-CM

## 2010-11-26 DIAGNOSIS — Z780 Asymptomatic menopausal state: Secondary | ICD-10-CM

## 2010-12-09 ENCOUNTER — Encounter (INDEPENDENT_AMBULATORY_CARE_PROVIDER_SITE_OTHER): Payer: Self-pay | Admitting: *Deleted

## 2010-12-11 ENCOUNTER — Encounter: Payer: Self-pay | Admitting: Gastroenterology

## 2010-12-12 ENCOUNTER — Encounter: Payer: Self-pay | Admitting: Family Medicine

## 2010-12-13 LAB — CONVERTED CEMR LAB: Glucose, Fasting: 112 mg/dL — ABNORMAL HIGH (ref 70–99)

## 2010-12-17 NOTE — Miscellaneous (Signed)
Summary: [facility] Previsit/prep  Clinical Lists Changes  Medications: Added new medication of MOVIPREP 100 GM  SOLR (PEG-KCL-NACL-NASULF-NA ASC-C) As per prep instructions. - Signed Rx of MOVIPREP 100 GM  SOLR (PEG-KCL-NACL-NASULF-NA ASC-C) As per prep instructions.;  #1 x 0;  Signed;  Entered by: Wyona Almas RN;  Authorized by: Louis Meckel MD;  Method used: Electronically to [facility] [address] [place], [place], [place]  [address], Ph: [phone], Fax: [phone] Observations: Added new observation of NKA: T (12/11/2010 12:46)    Prescriptions: MOVIPREP 100 GM  SOLR (PEG-KCL-NACL-NASULF-NA ASC-C) As per prep instructions.  #1 x 0   Entered by:   Wyona Almas RN   Authorized by:   Louis Meckel MD   Signed by:   Wyona Almas RN on 12/11/2010   Method used:   Electronically to        [facility] (retail)       [address]       [place], [place]  [address]       Ph: [phone]       Fax: [phone]   RxID:   [id number]

## 2010-12-17 NOTE — Letter (Signed)
Summary: Moviprep Instructions  [facility]  [address]   [place], [place] [address]   Phone: [phone]  Fax: [phone]       Theresa [name]    10-27-1935    MRN: [id number]        Procedure Day Dorna Bloom: Wednesday, 12-25-10     Arrival Time: 8:30 a.m.     Procedure Time: 9:30 a.m.     Location of Procedure:                    x  [facility] (4th Floor)                        PREPARATION FOR COLONOSCOPY WITH MOVIPREP   Starting 5 days prior to your procedure 12-20-10 do not eat nuts, seeds, popcorn, corn, beans, peas,  salads, or any raw vegetables.  Do not take any fiber supplements (e.g. Metamucil, Citrucel, and Benefiber).  THE DAY BEFORE YOUR PROCEDURE         DATE: 12-24-10   DAY: Tuesday  1.  Drink clear liquids the entire day-NO SOLID FOOD  2.  Do not drink anything colored red or purple.  Avoid juices with pulp.  No orange juice.  3.  Drink at least 64 oz. (8 glasses) of fluid/clear liquids during the day to prevent dehydration and help the prep work efficiently.  CLEAR LIQUIDS INCLUDE: Water Jello Ice Popsicles Tea (sugar ok, no milk/cream) Powdered fruit flavored drinks Coffee (sugar ok, no milk/cream) Gatorade Juice: apple, white grape, white cranberry  Lemonade Clear bullion, consomm, broth Carbonated beverages (any kind) Strained chicken noodle soup Hard Candy                             4.  In the morning, mix first dose of MoviPrep solution:    Empty 1 Pouch A and 1 Pouch B into the disposable container    Add lukewarm drinking water to the top line of the container. Mix to dissolve    Refrigerate (mixed solution should be used within 24 hrs)  5.  Begin drinking the prep at 5:00 p.m. The MoviPrep container is divided by 4 marks.   Every 15 minutes drink the solution down to the next mark (approximately 8 oz) until the full liter is complete.   6.  Follow completed prep with 16 oz of clear liquid of your choice  (Nothing red or purple).  Continue to drink clear liquids until bedtime.  7.  Before going to bed, mix second dose of MoviPrep solution:    Empty 1 Pouch A and 1 Pouch B into the disposable container    Add lukewarm drinking water to the top line of the container. Mix to dissolve    Refrigerate  THE DAY OF YOUR PROCEDURE      DATE: 12-25-10  DAY: Wednesday  Beginning at 4:30 a.m. (5 hours before procedure):         1. Every 15 minutes, drink the solution down to the next mark (approx 8 oz) until the full liter is complete.  2. Follow completed prep with 16 oz. of clear liquid of your choice.    3. You may drink clear liquids until 7:30 a.m. (2 HOURS BEFORE PROCEDURE).   MEDICATION INSTRUCTIONS  Unless otherwise instructed, you should take regular prescription medications with a small sip of water   as early as possible the morning  of your procedure.   Additional medication instructions: Hold HCTZ the morning of procedure.         OTHER INSTRUCTIONS  You will need a responsible adult at least 73 years of age to accompany you and drive you home.   This person must remain in the waiting room during your procedure.  Wear loose fitting clothing that is easily removed.  Leave jewelry and other valuables at home.  However, you may wish to bring a book to read or  an iPod/MP3 player to listen to music as you wait for your procedure to start.  Remove all body piercing jewelry and leave at home.  Total time from sign-in until discharge is approximately 2-3 hours.  You should go home directly after your procedure and rest.  You can resume normal activities the  day after your procedure.  The day of your procedure you should not:   Drive   Make legal decisions   Operate machinery   Drink alcohol   Return to work  You will receive specific instructions about eating, activities and medications before you leave.    The above instructions have been reviewed and  explained to me by   Wyona Almas RN  December 11, 2010 1:36 PM     I fully understand and can verbalize these instructions _____________________________ Date _________

## 2010-12-25 ENCOUNTER — Ambulatory Visit: Payer: Medicare HMO | Admitting: Gastroenterology

## 2010-12-25 ENCOUNTER — Encounter: Payer: Self-pay | Admitting: Gastroenterology

## 2010-12-25 VITALS — BP 149/80 | HR 64 | Temp 97.8°F | Resp 16 | Ht 63.0 in | Wt 184.0 lb

## 2010-12-25 DIAGNOSIS — K5730 Diverticulosis of large intestine without perforation or abscess without bleeding: Secondary | ICD-10-CM

## 2010-12-25 DIAGNOSIS — Z1211 Encounter for screening for malignant neoplasm of colon: Secondary | ICD-10-CM

## 2010-12-25 LAB — GLUCOSE, CAPILLARY: Glucose-Capillary: 86 mg/dL (ref 70–99)

## 2010-12-25 NOTE — Progress Notes (Signed)
MELANOSIS, DIVERTICULOSIS PER MD

## 2010-12-25 NOTE — Patient Instructions (Signed)
Mild diverticulosis and melanosis.  Otherwise normal exam.  Repeat colonoscopy in 10 yrs unless you are having new problems or symptoms.  Then call for appointment with Dr. Arlyce Dice.  Information given on diverticulosis and high fiber diet to take home to read.

## 2010-12-26 ENCOUNTER — Telehealth: Payer: Self-pay

## 2010-12-26 NOTE — Telephone Encounter (Signed)

## 2011-01-02 NOTE — Procedures (Signed)
Summary: Colonoscopy  Patient: Theresa [name] Note: All result statuses are Final unless otherwise noted.  Tests: (1) Colonoscopy (COL)   COL Colonoscopy           DONE     [facility]     [address]     [place], [place]  [address]          COLONOSCOPY PROCEDURE REPORT          PATIENT:  [name], Theresa  MR#:  [id number]     BIRTHDATE:  09/03/1936, 74 yrs. old  GENDER:  female          ENDOSCOPIST:  Barbette Hair. Arlyce Dice, MD     Referred by:  Seymour Bars, D.O.          PROCEDURE DATE:  12/25/2010     PROCEDURE:  Colonoscopy 29562     ASA CLASS:  Class II     INDICATIONS:  1) Routine Risk Screening          MEDICATIONS:   Fentanyl 50 mcg IV, Versed 7 mg IV          DESCRIPTION OF PROCEDURE:   After the risks benefits and     alternatives of the procedure were thoroughly explained, informed     consent was obtained.  Digital rectal exam was performed and     revealed no abnormalities.   The LB 180AL [id number] endoscope was     introduced through the anus and advanced to the cecum, which was     identified by the ileocecal valve, without limitations.  The     quality of the prep was Moviprep fair.  The instrument was then     slowly withdrawn as the colon was fully examined.     <<PROCEDUREIMAGES>>          FINDINGS:  Mild diverticulosis was found in the descending colon     (see image7).  Melanosis coli was found. Mild melanosis  This was     otherwise a normal examination of the colon (see image2, image4,     image6, image10, image11, and image13).   Retroflexed views in the     rectum revealed no abnormalities.    The time to cecum =  2.25     minutes. The scope was then withdrawn (time =  6.50  min) from the     patient and the procedure completed.          COMPLICATIONS:  None          ENDOSCOPIC IMPRESSION:     1) Mild diverticulosis in the descending colon     2) Melanosis     3) Otherwise normal examination     RECOMMENDATIONS:     1) Continue current  colorectal screening recommendations for     "routine risk" patients with a repeat colonoscopy in 10 years.          REPEAT EXAM:   10 year(s) Colonoscopy          ______________________________     Barbette Hair. Arlyce Dice, MD          CC:          n.     eSIGNED:   Barbette Hair. Jayse Hodkinson at 12/25/2010 10:13 AM          Freddie Breech, [id number]  Note: An exclamation mark (!) indicates a result that was not dispersed into the flowsheet. Document Creation Date: 12/25/2010 10:13 AM _______________________________________________________________________  (1)  Order result status: Final Collection or observation date-time: 12/25/2010 10:08 Requested date-time:  Receipt date-time:  Reported date-time:  Referring Physician:   Ordering Physician: Melvia Heaps [phone]) Specimen Source:  Source: Launa Grill Order Number: [id number] Lab site:

## 2011-02-13 ENCOUNTER — Encounter: Payer: Self-pay | Admitting: Family Medicine

## 2011-02-13 ENCOUNTER — Ambulatory Visit (INDEPENDENT_AMBULATORY_CARE_PROVIDER_SITE_OTHER): Payer: Medicare HMO | Admitting: Family Medicine

## 2011-02-13 VITALS — BP 126/67 | HR 66 | Ht 63.0 in | Wt 199.0 lb

## 2011-02-13 DIAGNOSIS — R7301 Impaired fasting glucose: Secondary | ICD-10-CM | POA: Insufficient documentation

## 2011-02-13 DIAGNOSIS — I10 Essential (primary) hypertension: Secondary | ICD-10-CM

## 2011-02-13 MED ORDER — HYDROCHLOROTHIAZIDE 25 MG PO TABS: 25.0000 mg | ORAL_TABLET | Freq: Every day | ORAL | Status: DC

## 2011-02-13 MED ORDER — METOPROLOL SUCCINATE ER 50 MG PO TB24: 50.0000 mg | ORAL_TABLET | Freq: Every day | ORAL | Status: DC

## 2011-02-13 MED ORDER — AMLODIPINE BESYLATE 5 MG PO TABS: 5.0000 mg | ORAL_TABLET | Freq: Every day | ORAL | Status: DC

## 2011-02-13 NOTE — Progress Notes (Signed)
  Subjective:    Patient ID: Theresa [name], female    DOB: 09-Apr-1936, 75 y.o.   MRN: [id number]  HPI 75 yo AAF presents for f/u visit.  She did a 2 hr OGTT in March and testing into the T2DM range.  Her A1C remains in the Pre- diabetic range at 6.1.  She is really trying to stick to a low sugar, low carb diet and doing Insanity workout 6 days/ wk.  She is frustrated that she has gained 2 lbs.  She started this 2 wks ago.  She is feeling well, starting to lose energy and gain energy.  Denies chest pain or DOE with exercise.  She refused to take metformin, saying she had GI upset from it yrs ago.  Doing well w/ BP meds but stopped Avapro several wks ago.  BP 126/67  Pulse 66  Ht 5\' 3"  (1.6 m)  Wt 199 lb (90.266 kg)  BMI 35.25 kg/m2  SpO2 100%    Review of Systems  Constitutional: Negative for fatigue and unexpected weight change.  Eyes: Negative for visual disturbance.  Respiratory: Negative for shortness of breath.   Cardiovascular: Negative for chest pain, palpitations and leg swelling.  Genitourinary: Negative for frequency.  Neurological: Negative for dizziness.  Psychiatric/Behavioral: Negative for sleep disturbance and dysphoric mood. The patient is not nervous/anxious.        Objective:   Physical Exam  Constitutional: She appears well-developed and well-nourished. No distress.       obese  Eyes: Pupils are equal, round, and reactive to light.  Neck: No thyromegaly present.  Cardiovascular: Normal rate, regular rhythm and normal heart sounds.   No murmur heard. Pulmonary/Chest: Effort normal and breath sounds normal. No respiratory distress. She has no wheezes.  Musculoskeletal: She exhibits no edema.  Psychiatric: She has a normal mood and affect.          Assessment & Plan:

## 2011-02-13 NOTE — Assessment & Plan Note (Signed)
I reviewed her results from her 2 hr OGTT c/w T2DM and not IFG, however her A1C is 6.1 today and her home sugars are more c/w IFG.  She refused to take metformin, saying that it caused GI upset in the past.  She will continue a strict low sugar/ low carb diet with vigorous exercise of 1 hr 5-6 days/ wk.  Return for f/u in 6 mos.  Call if any problems.

## 2011-02-13 NOTE — Assessment & Plan Note (Signed)
BP much improved.  She later informed me that she had not been taking Avapro and wants to stay off it.  Since her BP looks great, will keep her on this regimen and monitor for microalbuminuria.  If she turns out +, would change her Toprol to Avapro.

## 2011-02-13 NOTE — Patient Instructions (Signed)
Keep up the good work with diet and exercise.  For BP, stay on Metoprolol XL 50 mg once daily, HCTZ 25 mg once daily (take in the morning) and Norvasc 5 mg once daily at night.  Call me if any problems.  Return for f/u in 6 mos.

## 2011-05-18 ENCOUNTER — Encounter (HOSPITAL_BASED_OUTPATIENT_CLINIC_OR_DEPARTMENT_OTHER): Payer: Self-pay | Admitting: *Deleted

## 2011-05-18 ENCOUNTER — Emergency Department (INDEPENDENT_AMBULATORY_CARE_PROVIDER_SITE_OTHER): Payer: Medicare HMO

## 2011-05-18 ENCOUNTER — Emergency Department (HOSPITAL_BASED_OUTPATIENT_CLINIC_OR_DEPARTMENT_OTHER)
Admission: EM | Admit: 2011-05-18 | Discharge: 2011-05-19 | Disposition: A | Discharge status: Home / Self Care | Payer: Medicare HMO | Attending: Emergency Medicine | Admitting: Emergency Medicine

## 2011-05-18 DIAGNOSIS — W010XXA Fall on same level from slipping, tripping and stumbling without subsequent striking against object, initial encounter: Secondary | ICD-10-CM | POA: Insufficient documentation

## 2011-05-18 DIAGNOSIS — M545 Low back pain, unspecified: Secondary | ICD-10-CM | POA: Insufficient documentation

## 2011-05-18 DIAGNOSIS — M47812 Spondylosis without myelopathy or radiculopathy, cervical region: Secondary | ICD-10-CM

## 2011-05-18 DIAGNOSIS — Y92009 Unspecified place in unspecified non-institutional (private) residence as the place of occurrence of the external cause: Secondary | ICD-10-CM | POA: Insufficient documentation

## 2011-05-18 DIAGNOSIS — I10 Essential (primary) hypertension: Secondary | ICD-10-CM | POA: Insufficient documentation

## 2011-05-18 DIAGNOSIS — M549 Dorsalgia, unspecified: Secondary | ICD-10-CM

## 2011-05-18 DIAGNOSIS — E119 Type 2 diabetes mellitus without complications: Secondary | ICD-10-CM | POA: Insufficient documentation

## 2011-05-18 DIAGNOSIS — M47814 Spondylosis without myelopathy or radiculopathy, thoracic region: Secondary | ICD-10-CM

## 2011-05-18 DIAGNOSIS — W19XXXA Unspecified fall, initial encounter: Secondary | ICD-10-CM

## 2011-05-18 DIAGNOSIS — T07XXXA Unspecified multiple injuries, initial encounter: Secondary | ICD-10-CM

## 2011-05-18 MED ORDER — OXYCODONE-ACETAMINOPHEN 5-325 MG PO TABS
2.0000 | ORAL_TABLET | Freq: Once | ORAL | Status: AC
  Administered 2011-05-18: 2 via ORAL
  Filled 2011-05-18: qty 2

## 2011-05-18 NOTE — ED Provider Notes (Signed)
History     CSN: [id number] Arrival date & time: 05/18/2011 10:17 PM  Chief Complaint  Patient presents with  . Fall   Patient is a 75 y.o. female presenting with fall. The history is provided by the patient and the EMS personnel.  Fall The accident occurred less than 1 hour ago. The fall occurred while walking. She fell from a height of 1 to 2 ft. She landed on carpet. The pain is at a severity of 5/10. The pain is moderate. She was not ambulatory at the scene. There was no entrapment after the fall. There was no drug use involved in the accident. There was no alcohol use involved in the accident. Pertinent negatives include no bowel incontinence, no nausea, no headaches, no hearing loss and no loss of consciousness. The symptoms are aggravated by pressure on the injury. Treatment on scene includes a c-collar and a backboard. She has tried nothing for the symptoms. The treatment provided no relief.   patient reports she tripped over a plastic computer mat at her home. Patient reports she fell with hands down. Patient reports she twisted her back and heard several pops. Patient reports she hit back on ground. Patient complains of pain in her mid back and her low back she denies any impact of her head. There was no loss of consciousness. Patient reports she has chronic soreness in her knees and her hips. She does not think the pain in her hips or knees is any different than her usual pain. Patient denies any upper extremity pain.  Past Medical History  Diagnosis Date  . Hypertension   . Diabetes mellitus     pt. does not take meds    Past Surgical History  Procedure Date  . Tonsillectomy   . Tubal ligation     Family History  Problem Relation Age of Onset  . Prostate cancer Brother     History  Substance Use Topics  . Smoking status: Never Smoker   . Smokeless tobacco: Never Used  . Alcohol Use: No    OB History    Grav Para Term Preterm Abortions TAB SAB Ect Mult Living             Review of Systems  Gastrointestinal: Negative for nausea and bowel incontinence.  Musculoskeletal: Positive for back pain.  Neurological: Negative for loss of consciousness and headaches.  All other systems reviewed and are negative.    Physical Exam  BP 135/71  Pulse 75  Temp(Src) 97.8 F (36.6 C) (Oral)  Resp 20  SpO2 95%  Physical Exam  Nursing note and vitals reviewed. Constitutional: She is oriented to person, place, and time. She appears well-developed and well-nourished.  HENT:  Head: Normocephalic.  Eyes: Conjunctivae are normal. Pupils are equal, round, and reactive to light.  Neck: Normal range of motion. Neck supple.  Cardiovascular: Normal rate.   Pulmonary/Chest: Effort normal and breath sounds normal.  Abdominal: Soft.  Musculoskeletal: She exhibits tenderness.       Tender thoracic and lumbar spine diffusely full range of motion lower extremities.  Neurological: She is alert and oriented to person, place, and time.  Skin: Skin is warm.  Psychiatric: She has a normal mood and affect.    ED Course  Procedures  MDM Pt's care turned over to Dr. Norlene Campbell with xrays pending.          Langston Masker, [place] 05/20/11 1552  .Medical screening examination/treatment/procedure(s) were conducted as a shared visit with non-physician practitioner(s) and  myself.  I personally evaluated the patient during the encounter   Olivia Mackie, MD 06/01/11 [id number]

## 2011-05-18 NOTE — ED Notes (Signed)
Pt fell in her room and hit her knee and lower back pain.

## 2011-05-18 NOTE — ED Notes (Signed)
Pt taken off immobilization board, cervical collar remains in place. Pt placed in hospital gown.. Daughter at bedside.

## 2011-05-19 MED ORDER — DIAZEPAM 5 MG PO TABS: 5.0000 mg | ORAL_TABLET | Freq: Three times a day (TID) | ORAL | Status: AC | PRN

## 2011-05-19 MED ORDER — DIAZEPAM 5 MG PO TABS
ORAL_TABLET | ORAL | Status: AC
  Filled 2011-05-19: qty 1

## 2011-05-19 MED ORDER — HYDROMORPHONE HCL 2 MG/ML IJ SOLN: 2.0000 mg | Freq: Once | INTRAMUSCULAR | Status: DC

## 2011-05-19 MED ORDER — HYDROMORPHONE HCL 2 MG/ML IJ SOLN
INTRAMUSCULAR | Status: AC
  Administered 2011-05-19: 2 mg via INTRAMUSCULAR
  Filled 2011-05-19: qty 1

## 2011-05-19 MED ORDER — OXYCODONE-ACETAMINOPHEN 5-325 MG PO TABS: 2.0000 | ORAL_TABLET | ORAL | Status: AC | PRN

## 2011-05-19 MED ORDER — DIAZEPAM 5 MG PO TABS
5.0000 mg | ORAL_TABLET | Freq: Once | ORAL | Status: AC
  Administered 2011-05-19: 5 mg via ORAL

## 2011-05-19 NOTE — ED Provider Notes (Addendum)
History     CSN: [id number] Arrival date & time: 05/18/2011 10:17 PM  Chief Complaint  Patient presents with  . Fall   HPI  Past Medical History  Diagnosis Date  . Hypertension   . Diabetes mellitus     pt. does not take meds    Past Surgical History  Procedure Date  . Tonsillectomy   . Tubal ligation     Family History  Problem Relation Age of Onset  . Prostate cancer Brother     History  Substance Use Topics  . Smoking status: Never Smoker   . Smokeless tobacco: Never Used  . Alcohol Use: No    OB History    Grav Para Term Preterm Abortions TAB SAB Ect Mult Living                  Review of Systems  Physical Exam  BP 136/68  Pulse 75  Temp(Src) 97.8 F (36.6 C) (Oral)  Resp 18  SpO2 100%  Physical Exam  ED Course  Procedures 2:21 AM xrays show spondylolysis, degenerative changes.  Recommend f/u with MRI prn.  Pt initially not improved with percocet, valium, but now after im dilaudid able to ambulated well.  Will d/chome.  Results for orders placed in visit on 02/13/11  POCT GLYCOSYLATED HEMOGLOBIN (HGB A1C)      Component Value Range   Hemoglobin A1C 6.1     Dg Thoracic Spine 4v  05/19/2011  *RADIOLOGY REPORT*  Clinical Data: Fall.  Mid back pain.  THORACIC SPINE - 4+ VIEW  Comparison: None.  Findings: Advance cervical and thoracic spondylosis noted. Bridging osteophyte noted at multiple levels in the thoracic spine.  I do not observe a thoracic spine compression fracture or subluxation.  Tortuous thoracic aorta noted.  IMPRESSION:  1.  Prominent thoracic and lower cervical spondylosis.  I do not observe a fracture or acute subluxation. 2.  Tortuous thoracic aorta.  Original Report Authenticated By: Dellia Cloud, M.D.   Dg Lumbar Spine Complete  05/19/2011  *RADIOLOGY REPORT*  Clinical Data: Fall.  Low back pain.  LUMBAR SPINE - COMPLETE 4+ VIEW  Comparison: None.  Findings: There is 6 mm of anterior subluxation of L4 on L5, with 4 mm of  posterior subluxation of L2 on L3, and prominent multilevel spurring.  Considerable facet arthropathy is present in the lumbar spine, especially in the lower lumbar spine.  No vertebral compression fracture is identified.  There is partial sacralization of L5.  IMPRESSION:  1.  Cervical spondylosis with likely degenerative subluxations at L2-3 and L4-5.  No fracture is observed.  If symptoms persist despite conservative therapy, MRI followup may be warranted.  Original Report Authenticated By: Dellia Cloud, M.D.     MDM Muscle strain    Patient seen and examined with PA.  Pt with mechanical fall, pain to palpation of lower paraspinal muscles with normal neuro exam.   Olivia Mackie, MD 05/19/11 5621  Olivia Mackie, MD 06/20/11 [id number]

## 2011-05-19 NOTE — ED Notes (Signed)
Pt was able to get up and put clothes on. She is now sitting in the chair next to stretcher.

## 2011-05-23 ENCOUNTER — Ambulatory Visit (INDEPENDENT_AMBULATORY_CARE_PROVIDER_SITE_OTHER): Payer: Medicare HMO | Admitting: Family Medicine

## 2011-05-23 ENCOUNTER — Encounter: Payer: Self-pay | Admitting: Family Medicine

## 2011-05-23 DIAGNOSIS — M545 Low back pain, unspecified: Secondary | ICD-10-CM

## 2011-05-23 DIAGNOSIS — K5900 Constipation, unspecified: Secondary | ICD-10-CM

## 2011-05-23 NOTE — Progress Notes (Signed)
  Subjective:    Patient ID: Theresa [name], female    DOB: Apr 16, 1936, 75 y.o.   MRN: [id number]  HPI  Larey Seat on 05/18/11 after slipping on a computer mat and fell forward. She felt several pops in her back. She went to the emergency room at [facility]. She had x-rays of her spine that showed significant degenerative arthritis but no acute fractures or compression fractures. She is here to follow up today. She has been using perocet  Every 4 hours but stopped it because was getting so constipated. Last night took some valium and ibuprofen for her back and says this worked well.Rochele Pages a valium at 10am this AM.  Overall her bck is about 50% better but still some pain on the right side.  Her constipation she has tried Taking several sennokot and still no BM.  Took half a bottle of magnesium citrate and no bowel movement. This am took a dulcolax suppository. No abdominal pain or cramping.  He   Review of Systems     Objective:   Physical Exam  Constitutional: She appears well-developed and well-nourished.  HENT:  Head: Normocephalic and atraumatic.  Musculoskeletal:       Normal flexion and extension of her lumbar spine. She has decreased rotation right and left that is symmetric. Negative straight-leg raise bilaterally. Hip, knee, ankle strength is 5 over 5. She is mildly tender over the right lumbar paraspinous muscles. Nontender over the thoracic or lumbar spine.          Assessment & Plan:  Right lumbar back pain-at this point time she 60% improved after almost a week. I think this is great progress. We will hold off on further evaluation such as MRI. Continue home ibuprofen with food and water. I did warn her about potential for GI upset and stomach ulcers and to use sparingly and wean off as soon as she can. She can also use the Valium when necessary but I did discuss with her the potential for addiction and to make sure that she also went off this as soon as possible.  If she is not close to  baseline in the next 3-4 weeks out like a followup.  Constipation-at this point I think she needs to try an enema at home. She doesn't have any results please call the office. She can also add MiraLax her regimen and see if this helps as well but I think the enemas and the most important part. I do think her constipation is secondary to her recent Percocet use. She has already discontinued this medication.  Her blood pressure is elevated today and I did recommend she followup in a couple weeks and she's feeling better for a blood pressure and weight check.

## 2011-05-23 NOTE — Patient Instructions (Signed)
Please schedule a nurse visit for blood pressure and weight check.

## 2011-06-13 ENCOUNTER — Ambulatory Visit (INDEPENDENT_AMBULATORY_CARE_PROVIDER_SITE_OTHER): Payer: Medicare HMO | Admitting: Family Medicine

## 2011-06-13 DIAGNOSIS — I10 Essential (primary) hypertension: Secondary | ICD-10-CM

## 2011-06-13 NOTE — Progress Notes (Signed)
  Subjective:    Patient ID: Theresa [name], female    DOB: 10/11/1935, 75 y.o.   MRN: [id number]  HPI  Pt denies chest pain, SOB, dizziness, or heart palpitations.  Taking meds as directed w/o problems.  Denies medication side effects.  5 min spent with pt.     Review of Systems     Objective:   Physical Exam        Assessment & Plan:  Her blood pressure is better today. We will continue to follow and recheck in 3 months.

## 2011-08-26 ENCOUNTER — Encounter: Payer: Self-pay | Admitting: Family Medicine

## 2011-08-26 ENCOUNTER — Ambulatory Visit (INDEPENDENT_AMBULATORY_CARE_PROVIDER_SITE_OTHER): Payer: Medicare HMO | Admitting: Family Medicine

## 2011-08-26 DIAGNOSIS — E785 Hyperlipidemia, unspecified: Secondary | ICD-10-CM

## 2011-08-26 DIAGNOSIS — Z79899 Other long term (current) drug therapy: Secondary | ICD-10-CM

## 2011-08-26 DIAGNOSIS — Z0000 Encounter for general adult medical examination without abnormal findings: Secondary | ICD-10-CM

## 2011-08-26 DIAGNOSIS — R7309 Other abnormal glucose: Secondary | ICD-10-CM

## 2011-08-26 DIAGNOSIS — I10 Essential (primary) hypertension: Secondary | ICD-10-CM

## 2011-08-26 DIAGNOSIS — R739 Hyperglycemia, unspecified: Secondary | ICD-10-CM

## 2011-08-26 NOTE — Patient Instructions (Signed)
Monitoring for Diabetes There are two blood tests that help you monitor and manage your diabetes. These include: An A1c (hemoglobin A1c) test.  This test is an average of your glucose (or blood sugar) control over the past 3 months.  This is recommended as a way for you and your caregiver to understand how well your glucose levels are controlled on the average.  Your A1c goal will be determined by your caregiver, but it is usually best if it is less than 6.5% to 7.0%.  Glucose (sugar) attaches itself to red blood cells. The amount of glucose then can then be measured. The amount of glucose on the cells depends on how high your blood glucose has been.  SMBG test (self-monitoring blood glucose).  Using a blood glucose monitor (meter) to do SMBG testing is an easy way to monitor the amount of glucose in your blood and can help you improve your control. The monitor will tell you what your blood glucose is at that very moment. Every person with diabetes should have a blood glucose monitor and know how to use it. The better you control your blood sugar on a daily basis, the better your A1c levels will be.  HOW OFTEN SHOULD I HAVE AN A1C LEVEL? Every 3 months if your diabetes is not well controlled or if therapy has changed.  Every 6 months if you are meeting your treatment goals.  HOW OFTEN SHOULD I DO SMBG TESTING?  Your caregiver will recommend how often you should test. Testing times are based on the kind of medicine you take, type of diabetes you have, and your blood glucose control. Testing times can include: Type 1 diabetes: test 3 or 4 times a day or as directed.  Type 2 diabetes and if you are taking insulin and diabetes pills: test 3 or 4 times a day or as directed.  If you are taking diabetes pills only and not reaching your target A1c: test 2 to 4 times a day or as directed.  If you are taking diabetes pills and are controlling your diabetes well with diet and exercise, your caregiver will help  you decide what is appropriate.  WHAT TIME OF DAY SHOULD I TEST?  The best time of day to test your blood glucose depends on medications, mealtimes, exercise, and blood glucose control. It is best to test at different times because this will help you know how you are doing throughout the day. Your caregiver will help you decide what is best. WHAT SHOULD MY BLOOD GLUCOSE BE? Blood glucose target goals may vary depending on each persons needs, whether they have type 1 or type 2 diabetes or what medications they are taking. However, as a general rule, blood glucose should be: Before meals...70-130 mg/dl.  After meals .Marland Kitchen...less than 180 mg/dl.  CHECK YOUR BLOOD GLUCOSE IF: You have symptoms of low blood sugar (hypoglycemia), which may include dizziness, shaking, sweating, chills and confusion.  You have symptoms of high blood sugar (hyperglycemia), which may include sleepiness, blurred vision, frequent urination and excessive thirst.  You are learning how meals, physical activity and medicine affect your blood glucose level. The more you learn about how various foods, your medications, and activities affect you, the better job you will do of taking care of yourself.  You have a job in which poor control could cause safety problems while driving or operating machinery.  CHECK YOUR BLOOD SUGAR MORE FREQUENTLY: If you have medication or dietary changes.  If you begin  taking other kinds of medicines.  If you become sick or your level of stress increases. With an illness, your blood sugar may even be high without eating.  Before and after exercise.  Follow your caregiver's testing recommendations during this time.  TO DISPOSE OF SHARPS: Each city or state may have different regulations. Check with your public works or Engineer, structural. Sharps containers can be purchased from [employer] all used sharps in a container. You do not need to replace any protective covers over the needle or  break the needle.  Sharps should be contained in a ridge, leakproof, puncture-resistant container.  Plastic detergent bottle.  Bleach bottle.  When container is almost full, add a solution that is 1 part laundry bleach and 9 parts tap water (it is ok to use undiluted bleach if you wish). You may want to wear gloves since bleach can damage tissue. Let the solution sit for 30 minutes.  Carefully pour all the liquid into the sanitary sewer. Be sure to prevent the sharps from falling out.  Once liquid is drained, reseal the container with lid and tape it shut with duct tape. This will prevent the cap from coming off.  Dispose of the container with your regular household trash and waste. It is a good idea to let your trash hauler know that you will be disposing of sharps.  Document Released: 09/25/2003 Document Revised: 06/04/2011 Document Reviewed: 03/26/2009 [facility] Patient Information 2012 [place], [place].Cholesterol Cholesterol is a white, waxy, fat-like protein needed by your body in small amounts. The liver makes all the cholesterol you need. It is carried from the liver by the blood through the blood vessels. Deposits (plaque) may build up on blood vessel walls. This makes the arteries narrower and stiffer. Plaque increases the risk for heart attack and stroke. You cannot feel your cholesterol level even if it is very high. The only way to know is by a blood test to check your lipid (fats) levels. Once you know your cholesterol levels, you should keep a record of the test results. Work with your caregiver to to keep your levels in the desired range. WHAT THE RESULTS MEAN:  Total cholesterol is a rough measure of all the cholesterol in your blood.   LDL is the so-called bad cholesterol. This is the type that deposits cholesterol in the walls of the arteries. You want this level to be low.   HDL is the good cholesterol because it cleans the arteries and carries the LDL away. You want this level to  be high.   Triglycerides are fat that the body can either burn for energy or store. High levels are closely linked to heart disease.  DESIRED LEVELS:  Total cholesterol below 200.   LDL below 100 for people at risk, below 70 for very high risk.   HDL above 50 is good, above 60 is best.   Triglycerides below 150.  HOW TO LOWER YOUR CHOLESTEROL:  Diet.   Choose fish or white meat chicken and [place], roasted or baked. Limit fatty cuts of red meat, fried foods, and processed meats, such as sausage and lunch meat.   Eat lots of fresh fruits and vegetables. Choose whole grains, beans, pasta, potatoes and cereals.   Use only small amounts of olive, corn or canola oils. Avoid butter, mayonnaise, shortening or palm kernel oils. Avoid foods with trans-fats.   Use skim/nonfat milk and low-fat/nonfat yogurt and cheeses. Avoid whole milk, cream, ice cream, egg yolks and cheeses.  Healthy desserts include angel food cake, ginger snaps, animal crackers, hard candy, popsicles, and low-fat/nonfat frozen yogurt. Avoid pastries, cakes, pies and cookies.   Exercise.   A regular program helps decrease LDL and raises HDL.   Helps with weight control.   Do things that increase your activity level like gardening, walking, or taking the stairs.   Medication.   May be prescribed by your caregiver to help lowering cholesterol and the risk for heart disease.   You may need medicine even if your levels are normal if you have several risk factors.  HOME CARE INSTRUCTIONS   Follow your diet and exercise programs as suggested by your caregiver.   Take medications as directed.   Have blood work done when your caregiver feels it is necessary.  MAKE SURE YOU:   Understand these instructions.   Will watch your condition.   Will get help right away if you are not doing well or get worse.  Document Released: 06/17/2001 Document Revised: 06/04/2011 Document Reviewed: 12/08/2007 [facility] Patient  Information 2012 [place], [place].Hypertriglyceridemia  Diet for High blood levels of Triglycerides Most fats in food are triglycerides. Triglycerides in your blood are stored as fat in your body. High levels of triglycerides in your blood may put you at a greater risk for heart disease and stroke.  Normal triglyceride levels are less than 150 mg/dL. Borderline high levels are 150-199 mg/dl. High levels are 200 - 499 mg/dL, and very high triglyceride levels are greater than 500 mg/dL. The decision to treat high triglycerides is generally based on the level. For people with borderline or high triglyceride levels, treatment includes weight loss and exercise. Drugs are recommended for people with very high triglyceride levels. Many people who need treatment for high triglyceride levels have metabolic syndrome. This syndrome is a collection of disorders that often include: insulin resistance, high blood pressure, blood clotting problems, high cholesterol and triglycerides. TESTING PROCEDURE FOR TRIGLYCERIDES  You should not eat 4 hours before getting your triglycerides measured. The normal range of triglycerides is between 10 and 250 milligrams per deciliter (mg/dl). Some people may have extreme levels (1000 or above), but your triglyceride level may be too high if it is above 150 mg/dl, depending on what other risk factors you have for heart disease.   People with high blood triglycerides may also have high blood cholesterol levels. If you have high blood cholesterol as well as high blood triglycerides, your risk for heart disease is probably greater than if you only had high triglycerides. High blood cholesterol is one of the main risk factors for heart disease.  CHANGING YOUR DIET  Your weight can affect your blood triglyceride level. If you are more than 20% above your ideal body weight, you may be able to lower your blood triglycerides by losing weight. Eating less and exercising regularly is the best way  to combat this. Fat provides more calories than any other food. The best way to lose weight is to eat less fat. Only 30% of your total calories should come from fat. Less than 7% of your diet should come from saturated fat. A diet low in fat and saturated fat is the same as a diet to decrease blood cholesterol. By eating a diet lower in fat, you may lose weight, lower your blood cholesterol, and lower your blood triglyceride level.  Eating a diet low in fat, especially saturated fat, may also help you lower your blood triglyceride level. Ask your dietitian to help you figure  how much fat you can eat based on the number of calories your caregiver has prescribed for you.  Exercise, in addition to helping with weight loss may also help lower triglyceride levels.   Alcohol can increase blood triglycerides. You may need to stop drinking alcoholic beverages.   Too much carbohydrate in your diet may also increase your blood triglycerides. Some complex carbohydrates are necessary in your diet. These may include bread, rice, potatoes, other starchy vegetables and cereals.   Reduce "simple" carbohydrates. These may include pure sugars, candy, honey, and jelly without losing other nutrients. If you have the kind of high blood triglycerides that is affected by the amount of carbohydrates in your diet, you will need to eat less sugar and less high-sugar foods. Your caregiver can help you with this.   Adding 2-4 grams of fish oil (EPA+ DHA) may also help lower triglycerides. Speak with your caregiver before adding any supplements to your regimen.  Following the Diet  Maintain your ideal weight. Your caregivers can help you with a diet. Generally, eating less food and getting more exercise will help you lose weight. Joining a weight control group may also help. Ask your caregivers for a good weight control group in your area.  Eat low-fat foods instead of high-fat foods. This can help you lose weight too.  These  foods are lower in fat. Eat MORE of these:   Dried beans, peas, and lentils.   Egg whites.   Low-fat cottage cheese.   Fish.   Lean cuts of meat, such as round, sirloin, rump, and flank (cut extra fat off meat you fix).   Whole grain breads, cereals and pasta.   Skim and nonfat dry milk.   Low-fat yogurt.   Poultry without the skin.   Cheese made with skim or part-skim milk, such as mozzarella, parmesan, farmers', ricotta, or pot cheese.  These are higher fat foods. Eat LESS of these:   Whole milk and foods made from whole milk, such as American, blue, cheddar, monterey jack, and swiss cheese   High-fat meats, such as luncheon meats, sausages, knockwurst, bratwurst, hot dogs, ribs, corned beef, ground pork, and regular ground beef.   Fried foods.  Limit saturated fats in your diet. Substituting unsaturated fat for saturated fat may decrease your blood triglyceride level. You will need to read package labels to know which products contain saturated fats.  These foods are high in saturated fat. Eat LESS of these:   Fried pork skins.   Whole milk.   Skin and fat from poultry.   Palm oil.   Butter.   Shortening.   Cream cheese.   Tomasa Blase.   Margarines and baked goods made from listed oils.   Vegetable shortenings.   Chitterlings.   Fat from meats.   Coconut oil.   Palm kernel oil.   Lard.   Cream.   Sour cream.   Fatback.   Coffee whiteners and non-dairy creamers made with these oils.   Cheese made from whole milk.  Use unsaturated fats (both polyunsaturated and monounsaturated) moderately. Remember, even though unsaturated fats are better than saturated fats; you still want a diet low in total fat.  These foods are high in unsaturated fat:   Canola oil.   Sunflower oil.   Mayonnaise.   Almonds.   Peanuts.   Pine nuts.   Margarines made with these oils.   Safflower oil.   Olive oil.   Avocados.   Cashews.   Peanut butter.  Sunflower seeds.   Soybean oil.   Peanut oil.   Olives.   Pecans.   Walnuts.   Pumpkin seeds.  Avoid sugar and other high-sugar foods. This will decrease carbohydrates without decreasing other nutrients. Sugar in your food goes rapidly to your blood. When there is excess sugar in your blood, your liver may use it to make more triglycerides. Sugar also contains calories without other important nutrients.  Eat LESS of these:   Sugar, [place] sugar, powdered sugar, jam, jelly, preserves, honey, syrup, molasses, pies, candy, cakes, cookies, frosting, pastries, colas, soft drinks, punches, fruit drinks, and regular gelatin.   Avoid alcohol. Alcohol, even more than sugar, may increase blood triglycerides. In addition, alcohol is high in calories and low in nutrients. Ask for sparkling water, or a diet soft drink instead of an alcoholic beverage.  Suggestions for planning and preparing meals   Bake, broil, grill or roast meats instead of frying.   Remove fat from meats and skin from poultry before cooking.   Add spices, herbs, lemon juice or vinegar to vegetables instead of salt, rich sauces or gravies.   Use a non-stick skillet without fat or use no-stick sprays.   Cool and refrigerate stews and broth. Then remove the hardened fat floating on the surface before serving.   Refrigerate meat drippings and skim off fat to make low-fat gravies.   Serve more fish.   Use less butter, margarine and other high-fat spreads on bread or vegetables.   Use skim or reconstituted non-fat dry milk for cooking.   Cook with low-fat cheeses.   Substitute low-fat yogurt or cottage cheese for all or part of the sour cream in recipes for sauces, dips or congealed salads.   Use half yogurt/half mayonnaise in salad recipes.   Substitute evaporated skim milk for cream. Evaporated skim milk or reconstituted non-fat dry milk can be whipped and substituted for whipped cream in certain recipes.   Choose  fresh fruits for dessert instead of high-fat foods such as pies or cakes. Fruits are naturally low in fat.  When Dining Out   Order low-fat appetizers such as fruit or vegetable juice, pasta with vegetables or tomato sauce.   Select clear, rather than cream soups.   Ask that dressings and gravies be served on the side. Then use less of them.   Order foods that are baked, broiled, poached, steamed, stir-fried, or roasted.   Ask for margarine instead of butter, and use only a small amount.   Drink sparkling water, unsweetened tea or coffee, or diet soft drinks instead of alcohol or other sweet beverages.  QUESTIONS AND ANSWERS ABOUT OTHER FATS IN THE BLOOD: SATURATED FAT, TRANS FAT, AND CHOLESTEROL What is trans fat? Trans fat is a type of fat that is formed when vegetable oil is hardened through a process called hydrogenation. This process helps makes foods more solid, gives them shape, and prolongs their shelf life. Trans fats are also called hydrogenated or partially hydrogenated oils.  What do saturated fat, trans fat, and cholesterol in foods have to do with heart disease? Saturated fat, trans fat, and cholesterol in the diet all raise the level of LDL "bad" cholesterol in the blood. The higher the LDL cholesterol, the greater the risk for coronary heart disease (CHD). Saturated fat and trans fat raise LDL similarly.  What foods contain saturated fat, trans fat, and cholesterol? High amounts of saturated fat are found in animal products, such as fatty cuts of meat, chicken skin, and  full-fat dairy products like butter, whole milk, cream, and cheese, and in tropical vegetable oils such as palm, palm kernel, and coconut oil. Trans fat is found in some of the same foods as saturated fat, such as vegetable shortening, some margarines (especially hard or stick margarine), crackers, cookies, baked goods, fried foods, salad dressings, and other processed foods made with partially hydrogenated  vegetable oils. Small amounts of trans fat also occur naturally in some animal products, such as milk products, beef, and lamb. Foods high in cholesterol include liver, other organ meats, egg yolks, shrimp, and full-fat dairy products. How can I use the new food label to make heart-healthy food choices? Check the Nutrition Facts panel of the food label. Choose foods lower in saturated fat, trans fat, and cholesterol. For saturated fat and cholesterol, you can also use the Percent Daily Value (%DV): 5% DV or less is low, and 20% DV or more is high. (There is no %DV for trans fat.) Use the Nutrition Facts panel to choose foods low in saturated fat and cholesterol, and if the trans fat is not listed, read the ingredients and limit products that list shortening or hydrogenated or partially hydrogenated vegetable oil, which tend to be high in trans fat. POINTS TO REMEMBER: YOU NEED A LITTLE TLC (THERAPEUTIC LIFESTYLE CHANGES)  Discuss your risk for heart disease with your caregivers, and take steps to reduce risk factors.   Change your diet. Choose foods that are low in saturated fat, trans fat, and cholesterol.   Add exercise to your daily routine if it is not already being done. Participate in physical activity of moderate intensity, like brisk walking, for at least 30 minutes on most, and preferably all days of the week. No time? Break the 30 minutes into three, 10-minute segments during the day.   Stop smoking. If you do smoke, contact your caregiver to discuss ways in which they can help you quit.   Do not use street drugs.   Maintain a normal weight.   Maintain a healthy blood pressure.   Keep up with your blood work for checking the fats in your blood as directed by your caregiver.  Document Released: 07/10/2004 Document Revised: 06/04/2011 Document Reviewed: 02/05/2009 [facility] Patient Information 2012 [place], [place].

## 2011-08-26 NOTE — Progress Notes (Signed)
  Subjective:    Patient ID: Theresa [name], female    DOB: November 09, 1935, 75 y.o.   MRN: [id number]  Hypertension This is a chronic problem. The current episode started more than 1 year ago. The problem is unchanged. The problem is uncontrolled. Associated symptoms include malaise/fatigue. Risk factors for coronary artery disease include dyslipidemia, obesity and post-menopausal state. Past treatments include calcium channel blockers, diuretics and beta blockers. There are no compliance problems.   Hyperlipidemia This is a chronic problem. The current episode started more than 1 year ago. Recent lipid tests were reviewed and are variable. She is currently on no antihyperlipidemic treatment. The current treatment provides no improvement of lipids. Compliance problems include adherence to exercise.    Is here for followup last time when she had her physical apparently wasn't really any blood work drawn will get the blood work on her today she does have as above history of hypertension there is a question of hyperlipidemia but she's been taking medication. We'll have her followup in about 4 months. Should also notes that even though she was offered a flu shot she declines.   Review of Systems  Constitutional: Positive for malaise/fatigue.  Cardiovascular: Negative.   All other systems reviewed and are negative.      BP 159/85  Pulse 65  Ht 5\' 3"  (1.6 m)  Wt 203 lb (92.08 kg)  BMI 35.96 kg/m2  SpO2 99%  Objective:   Physical Exam  Constitutional: She is oriented to person, place, and time. She appears well-developed and well-nourished.  HENT:  Head: Normocephalic.  Neck: Normal range of motion.  Cardiovascular: Normal rate, regular rhythm and normal heart sounds.   Pulmonary/Chest: Effort normal and breath sounds normal.  Neurological: She is alert and oriented to person, place, and time.  Skin: Skin is warm and dry.  Psychiatric: She has a normal mood and affect.          Assessment  & Plan:  #1 she declines flu shot we talked about that #2 hypertension blood pressure is elevated 159/85 however she's been checking her blood pressure home she reports that when her blood pressure is at home is using the low 140s and the diastolic disease the symptoms were in the 70s. Explained to her as long as he can maintain a log of blood pressure and being like that are not making changes with a blood pressure medicine but the first one that I would change would be to increase Toprol from 50-100. #3 hyperlipidemia -- the challenge will get him blood work she is fasting but she really does want to use the statins we talked about other cholesterol medication regularly using Zetia to help her cholesterol. If needed. #4 health maintenance we will need to get a TSH B12 CBC CMP and a A1c things were not done at physical. We'll get a D3 as well. She is on calcium supplement.

## 2011-08-27 LAB — CBC WITH DIFFERENTIAL/PLATELET
Eosinophils Absolute: 0.1 10*3/uL (ref 0.0–0.7)
Hemoglobin: 12.6 g/dL (ref 12.0–15.0)
Lymphs Abs: 2.5 10*3/uL (ref 0.7–4.0)
MCH: 30 pg (ref 26.0–34.0)
Monocytes Relative: 5 % (ref 3–12)
Neutro Abs: 5.1 10*3/uL (ref 1.7–7.7)
Neutrophils Relative %: 62 % (ref 43–77)
Platelets: 341 10*3/uL (ref 150–400)
RBC: 4.2 MIL/uL (ref 3.87–5.11)
WBC: 8.1 10*3/uL (ref 4.0–10.5)

## 2011-08-27 LAB — COMPLETE METABOLIC PANEL WITH GFR
ALT: 12 U/L (ref 0–35)
Albumin: 4.5 g/dL (ref 3.5–5.2)
Alkaline Phosphatase: 76 U/L (ref 39–117)
CO2: 30 mEq/L (ref 19–32)
GFR, Est African American: >89 mL/min
GFR, Est Non African American: 87 mL/min
Glucose, Bld: 110 mg/dL — ABNORMAL HIGH (ref 70–99)
Potassium: 3.9 mEq/L (ref 3.5–5.3)
Sodium: 142 mEq/L (ref 135–145)
Total Bilirubin: 0.9 mg/dL (ref 0.3–1.2)
Total Protein: 7.3 g/dL (ref 6.0–8.3)

## 2011-08-27 LAB — LIPID PANEL
Cholesterol: 294 mg/dL — ABNORMAL HIGH (ref 0–200)
LDL Cholesterol: 201 mg/dL — ABNORMAL HIGH (ref 0–99)
Total CHOL/HDL Ratio: 4.1 Ratio
Triglycerides: 103 mg/dL (ref <150)
VLDL: 21 mg/dL (ref 0–40)

## 2011-08-27 LAB — TSH: TSH: 1.001 u[IU]/mL (ref 0.350–4.500)

## 2011-09-03 ENCOUNTER — Encounter: Payer: Self-pay | Admitting: Family Medicine

## 2011-11-07 ENCOUNTER — Other Ambulatory Visit: Payer: Self-pay | Admitting: Family Medicine

## 2011-11-07 DIAGNOSIS — Z1231 Encounter for screening mammogram for malignant neoplasm of breast: Secondary | ICD-10-CM

## 2011-12-02 ENCOUNTER — Ambulatory Visit
Admission: RE | Admit: 2011-12-02 | Discharge: 2011-12-02 | Disposition: A | Discharge status: Home / Self Care | Payer: Medicare HMO | Source: Ambulatory Visit | Attending: Family Medicine | Admitting: Family Medicine

## 2011-12-02 DIAGNOSIS — Z1231 Encounter for screening mammogram for malignant neoplasm of breast: Secondary | ICD-10-CM

## 2011-12-17 ENCOUNTER — Encounter: Payer: Self-pay | Admitting: *Deleted

## 2011-12-25 ENCOUNTER — Ambulatory Visit (INDEPENDENT_AMBULATORY_CARE_PROVIDER_SITE_OTHER): Payer: Medicare HMO | Admitting: Family Medicine

## 2011-12-25 ENCOUNTER — Encounter: Payer: Self-pay | Admitting: Family Medicine

## 2011-12-25 VITALS — BP 157/73 | HR 64 | Ht 63.0 in | Wt 203.0 lb

## 2011-12-25 DIAGNOSIS — I10 Essential (primary) hypertension: Secondary | ICD-10-CM

## 2011-12-25 DIAGNOSIS — Z136 Encounter for screening for cardiovascular disorders: Secondary | ICD-10-CM

## 2011-12-25 DIAGNOSIS — E785 Hyperlipidemia, unspecified: Secondary | ICD-10-CM

## 2011-12-25 DIAGNOSIS — R001 Bradycardia, unspecified: Secondary | ICD-10-CM

## 2011-12-25 DIAGNOSIS — Z131 Encounter for screening for diabetes mellitus: Secondary | ICD-10-CM

## 2011-12-25 DIAGNOSIS — Z0000 Encounter for general adult medical examination without abnormal findings: Secondary | ICD-10-CM

## 2011-12-25 LAB — CBC WITH DIFFERENTIAL/PLATELET
Eosinophils Absolute: 0.1 10*3/uL (ref 0.0–0.7)
HCT: 37.4 % (ref 36.0–46.0)
Hemoglobin: 11.7 g/dL — ABNORMAL LOW (ref 12.0–15.0)
Lymphs Abs: 2.5 10*3/uL (ref 0.7–4.0)
MCH: 29.3 pg (ref 26.0–34.0)
Monocytes Absolute: 0.4 10*3/uL (ref 0.1–1.0)
Monocytes Relative: 5 % (ref 3–12)
Neutrophils Relative %: 61 % (ref 43–77)
RBC: 4 MIL/uL (ref 3.87–5.11)

## 2011-12-25 LAB — POCT URINALYSIS DIPSTICK
Bilirubin, UA: NEGATIVE
Blood, UA: NEGATIVE
Ketones, UA: NEGATIVE
Protein, UA: NEGATIVE
Spec Grav, UA: 1.025
pH, UA: 7

## 2011-12-25 MED ORDER — FLUTICASONE PROPIONATE 50 MCG/ACT NA SUSP: 2.0000 | Freq: Every day | NASAL | Status: DC | PRN

## 2011-12-25 MED ORDER — IRBESARTAN 300 MG PO TABS: 300.0000 mg | ORAL_TABLET | Freq: Every day | ORAL | Status: DC

## 2011-12-25 NOTE — Progress Notes (Signed)
Of she really is a of his physical symptoms he is a and and and she is a Subjective:    Theresa [name] is a 76 y.o. female who presents for Medicare Annual/Subsequent preventive examination.  Preventive Screening-Counseling & Management  Tobacco History  Smoking status  . Never Smoker   Smokeless tobacco  . Never Used     Problems Prior to Visit 1.   Current Problems (verified) Patient Active Problem List  Diagnoses  . HYPERLIPIDEMIA  . OBESITY, UNSPECIFIED  . ESSENTIAL HYPERTENSION, BENIGN  . DEGENERATIVE JOINT DISEASE, KNEES, BILATERAL  . POSTMENOPAUSAL STATUS  . Impaired fasting glucose    Medications Prior to Visit Current Outpatient Prescriptions on File Prior to Visit  Medication Sig Dispense Refill  . amLODipine (NORVASC) 5 MG tablet Take 1 tablet (5 mg total) by mouth daily.  90 tablet  3  . calcium carbonate (OS-CAL) 600 MG TABS Take 600 mg by mouth 2 (two) times daily.        . fish oil-omega-3 fatty acids 1000 MG capsule Take 1 g by mouth 2 (two) times daily.       . Glucosamine-Chondroitin-Vit D3 1500-1200-800 MG-MG-UNIT PACK Take 1 tablet by mouth 2 (two) times daily.       . hydrochlorothiazide 25 MG tablet Take 1 tablet (25 mg total) by mouth daily.  90 tablet  3  . Methylsulfonylmethane (MSM) 1000 MG CAPS Take 2 capsules by mouth 2 (two) times daily.        . Multiple Vitamins-Minerals (MULTIVITAL) tablet Take 1 tablet by mouth daily.        . Nutritional Supplements (COMPLETE NUTRITION PO) Take 1 scoop by mouth daily.        Marland Kitchen NUTRITIONAL SUPPLEMENTS PO Take by mouth.        . Potassium 99 MG TABS Take 2 tablets by mouth daily.        Marland Kitchen DISCONTD: fluticasone (FLONASE) 50 MCG/ACT nasal spray Place 2 sprays into the nose daily as needed. For congestion        Current Medications (verified) Current Outpatient Prescriptions  Medication Sig Dispense Refill  . amLODipine (NORVASC) 5 MG tablet Take 1 tablet (5 mg total) by mouth daily.  90 tablet  3  .  calcium carbonate (OS-CAL) 600 MG TABS Take 600 mg by mouth 2 (two) times daily.        . fish oil-omega-3 fatty acids 1000 MG capsule Take 1 g by mouth 2 (two) times daily.       . fluticasone (FLONASE) 50 MCG/ACT nasal spray Place 2 sprays into the nose daily as needed. For congestion  48 g  3  . Glucosamine-Chondroitin-Vit D3 1500-1200-800 MG-MG-UNIT PACK Take 1 tablet by mouth 2 (two) times daily.       . hydrochlorothiazide 25 MG tablet Take 1 tablet (25 mg total) by mouth daily.  90 tablet  3  . Methylsulfonylmethane (MSM) 1000 MG CAPS Take 2 capsules by mouth 2 (two) times daily.        . Multiple Vitamins-Minerals (MULTIVITAL) tablet Take 1 tablet by mouth daily.        . Nutritional Supplements (COMPLETE NUTRITION PO) Take 1 scoop by mouth daily.        Marland Kitchen NUTRITIONAL SUPPLEMENTS PO Take by mouth.        . Potassium 99 MG TABS Take 2 tablets by mouth daily.        Marland Kitchen DISCONTD: fluticasone (FLONASE) 50 MCG/ACT nasal spray Place 2 sprays  into the nose daily as needed. For congestion      . irbesartan (AVAPRO) 300 MG tablet Take 1 tablet (300 mg total) by mouth daily.  90 tablet  3     Allergies (verified) Review of patient's allergies indicates no known allergies.   PAST HISTORY  Family History Family History  Problem Relation Age of Onset  . Prostate cancer Brother     Social History History  Substance Use Topics  . Smoking status: Never Smoker   . Smokeless tobacco: Never Used  . Alcohol Use: No     Are there smokers in your home (other than you)? No  Risk Factors Current exercise habits: Home exercise routine includes various home exerises.  Dietary issues discussed: none  Cardiac risk factors: advanced age (older than 68 for men, 53 for women), hypertension, obesity (BMI >= 30 kg/m2) and sedentary lifestyle.  Depression Screen (Note: if answer to either of the following is "Yes", a more complete depression screening is indicated)   Over the past two weeks, have you  felt down, depressed or hopeless? No  Over the past two weeks, have you felt little interest or pleasure in doing things? No  Have you lost interest or pleasure in daily life? No  Do you often feel hopeless? No  Do you cry easily over simple problems? No  Activities of Daily Living In your present state of health, do you have any difficulty performing the following activities?:  Driving? No Managing money?  No Feeding yourself? No Getting from bed to chair? No Climbing a flight of stairs? No Preparing food and eating?: No Bathing or showering? No Getting dressed: No Getting to the toilet? No Using the toilet:No Moving around from place to place: No In the past year have you fallen or had a near fall?:No   Are you sexually active?  No  Do you have more than one partner?  No  Hearing Difficulties: No Do you often ask people to speak up or repeat themselves? No Do you experience ringing or noises in your ears? No Do you have difficulty understanding soft or whispered voices? No   Do you feel that you have a problem with memory? No  Do you often misplace items? No  Do you feel safe at home?  Yes  Cognitive Testing  Alert? Yes  Normal Appearance?Yes  Oriented to person? Yes  Place? Yes   Time? Yes  Recall of three objects?  Yes  Can perform simple calculations? Yes  Displays appropriate judgment?Yes  Can read the correct time from a watch face?Yes   Advanced Directives have been discussed with the patient? No  List the Names of Other Physician/Practitioners you currently use: 1.    Indicate any recent Medical Services you may have received from other than [facility] providers in the past year (date may be approximate).  Immunization History  Administered Date(s) Administered  . Td 11/13/2010  . Zoster 12/05/2010    Screening Tests Health Maintenance  Topic Date Due  . Influenza Vaccine  07/26/2012  . Tetanus/tdap  11/13/2020  . Colonoscopy  12/24/2020  . Pneumococcal  Polysaccharide Vaccine Age 74 And Over  Addressed  . Zostavax  Completed    All answers were reviewed with the patient and necessary referrals were made:  Hassan Rowan, MD   12/25/2011   History reviewed: current medications, past family history, past medical history, past social history, past surgical history and problem list  Review of Systems Pertinent items are noted  in HPI.    Objective:     Vision by Snellen chart: right eye:not done, left eye:not done  Body mass index is 35.96 kg/(m^2). BP 157/73  Pulse 64  Ht 5\' 3"  (1.6 m)  Wt 203 lb (92.08 kg)  BMI 35.96 kg/m2  SpO2 97%  General appearance: alert, cooperative and appears stated age Head: Normocephalic, without obvious abnormality, atraumatic Eyes: conjunctivae/corneas clear. PERRL, EOM's intact. Fundi benign. Ears: normal TM's and external ear canals both ears Nose: Nares normal. Septum midline. Mucosa normal. No drainage or sinus tenderness. Throat: lips, mucosa, and tongue normal; teeth and gums normal Neck: no adenopathy, no carotid bruit, no JVD, supple, symmetrical, trachea midline and thyroid not enlarged, symmetric, no tenderness/mass/nodules Back: symmetric, no curvature. ROM normal. No CVA tenderness. Lungs: clear to auscultation bilaterally and normal percussion bilaterally Breasts: normal appearance, no masses or tenderness Heart: regular rate and rhythm, S1, S2 normal, no murmur, click, rub or gallop Abdomen: soft, non-tender; bowel sounds normal; no masses,  no organomegaly Extremities: extremities normal, atraumatic, no cyanosis or edema and both big toe are bandage SP toe nail removal       Results for orders placed in visit on 12/25/11  POCT URINALYSIS DIPSTICK      Component Value Range   Color, UA yellow     Clarity, UA clear     Glucose, UA neg     Bilirubin, UA neg     Ketones, UA neg     Spec Grav, UA 1.025     Blood, UA neg     pH, UA 7.0     Protein, UA neg     Urobilinogen, UA 0.2      Nitrite, UA neg     Leukocytes, UA small (1+)    LIPID PANEL      Component Value Range   Cholesterol 219 (*) 0 - 200 (mg/dL)   Triglycerides 67  <161 (mg/dL)   HDL 69  >09 (mg/dL)   Total CHOL/HDL Ratio 3.2     VLDL 13  0 - 40 (mg/dL)   LDL Cholesterol 604 (*) 0 - 99 (mg/dL)  COMPLETE METABOLIC PANEL WITH GFR      Component Value Range   Sodium 142  135 - 145 (mEq/L)   Potassium 3.8  3.5 - 5.3 (mEq/L)   Chloride 103  96 - 112 (mEq/L)   CO2 31  19 - 32 (mEq/L)   Glucose, Bld 120 (*) 70 - 99 (mg/dL)   BUN 15  6 - 23 (mg/dL)   Creat 5.40  9.81 - 1.91 (mg/dL)   Total Bilirubin 1.0  0.3 - 1.2 (mg/dL)   Alkaline Phosphatase 63  39 - 117 (U/L)   AST 17  0 - 37 (U/L)   ALT 14  0 - 35 (U/L)   Total Protein 6.9  6.0 - 8.3 (g/dL)   Albumin 4.4  3.5 - 5.2 (g/dL)   Calcium 9.8  8.4 - 47.8 (mg/dL)   GFR, Est African American >89     GFR, Est Non African American 88    TSH      Component Value Range   TSH 1.610  0.350 - 4.500 (uIU/mL)  CBC WITH DIFFERENTIAL      Component Value Range   WBC 7.8  4.0 - 10.5 (K/uL)   RBC 4.00  3.87 - 5.11 (MIL/uL)   Hemoglobin 11.7 (*) 12.0 - 15.0 (g/dL)   HCT 29.5  62.1 - 30.8 (%)   MCV 93.5  78.0 - 100.0 (fL)   MCH 29.3  26.0 - 34.0 (pg)   MCHC 31.3  30.0 - 36.0 (g/dL)   RDW 16.1  09.6 - 04.5 (%)   Platelets 312  150 - 400 (K/uL)   Neutrophils Relative 61  43 - 77 (%)   Neutro Abs 4.7  1.7 - 7.7 (K/uL)   Lymphocytes Relative 32  12 - 46 (%)   Lymphs Abs 2.5  0.7 - 4.0 (K/uL)   Monocytes Relative 5  3 - 12 (%)   Monocytes Absolute 0.4  0.1 - 1.0 (K/uL)   Eosinophils Relative 1  0 - 5 (%)   Eosinophils Absolute 0.1  0.0 - 0.7 (K/uL)   Basophils Relative 1  0 - 1 (%)   Basophils Absolute 0.1  0.0 - 0.1 (K/uL)   Smear Review Criteria for review not met     Assessment:     Health maintence Hypertension Bradycardia present     Plan:  Will stop Metropolol.  Restart Avapro since it is generic now. Will use Avapro 300 mg she can start off  at 1/2 tablet a day and see if this corrects the Bradycardia   During the course of the visit the patient was educated and counseled about appropriate screening and preventive services including:    Reviewed immunizationstatus. She declines flu shot and has had her pneumonia vaccination in [place]  Diet review for nutrition referral? Yes ____  Not Indicated _x___   Patient Instructions (the written plan) was given to the patient.  Medicare Attestation I have personally reviewed: The patient's medical and social history Their use of alcohol, tobacco or illicit drugs Their current medications and supplements The patient's functional ability including ADLs,fall risks, home safety risks, cognitive, and hearing and visual impairment Diet and physical activities Evidence for depression or mood disorders  The patient's weight, height, BMI,  have been recorded in the chart.  I have made referrals, counseling, and provided education to the patient based on review of the above and I have provided the patient with a written personalized care plan for preventive services.     Hassan Rowan, MD   12/25/2011      f2

## 2011-12-25 NOTE — Patient Instructions (Signed)
Bradycardia Bradycardia is a term for a heart rate (pulse) that, in adults, is slower than 60 beats per minute. A normal rate is 60 to 100 beats per minute. A heart rate below 60 beats per minute may be normal for some adults with healthy hearts. If the rate is too slow, the heart may have trouble pumping the volume of blood the body needs. If the heart rate gets too low, blood flow to the brain may be decreased and may make you feel lightheaded, dizzy, or faint. The heart has a natural pacemaker in the top of the heart called the SA node (sinoatrial or sinus node). This pacemaker sends out regular electrical signals to the muscle of the heart, telling the heart muscle when to beat (contract). The electrical signal travels from the upper parts of the heart (atria) through the AV node (atrioventricular node), to the lower chambers of the heart (ventricles). The ventricles squeeze, pumping the blood from your heart to your lungs and to the rest of your body. CAUSES   Problem with the heart's electrical system.   Problem with the heart's natural pacemaker.   Heart disease, damage, or infection.   Medications.   Problems with minerals and salts (electrolytes).  SYMPTOMS   Fainting (syncope).   Fatigue and weakness.   Shortness of breath (dyspnea).   Chest pain (angina).   Drowsiness.   Confusion.  DIAGNOSIS   An electrocardiogram (ECG) can help your caregiver determine the type of slow heart rate you have.   If the cause is not seen on an ECG, you may need to wear a heart monitor that records your heart rhythm for several hours or days.   Blood tests.  TREATMENT   Electrolyte supplements.   Medications.   Withholding medication which is causing a slow heart rate.   Pacemaker placement.  SEEK IMMEDIATE MEDICAL CARE IF:   You feel lightheaded or faint.   You develop an irregular heart rate.   You feel chest pain or have trouble breathing.  MAKE SURE YOU:   Understand  these instructions.   Will watch your condition.   Will get help right away if you are not doing well or get worse.  Document Released: 06/14/2002 Document Revised: 09/11/2011 Document Reviewed: 05/10/2008 [employer] Patient Information 2012 [employer]. 

## 2011-12-26 LAB — LIPID PANEL
HDL: 69 mg/dL (ref >39)
LDL Cholesterol: 137 mg/dL — ABNORMAL HIGH (ref 0–99)
Total CHOL/HDL Ratio: 3.2 Ratio
VLDL: 13 mg/dL (ref 0–40)

## 2011-12-26 LAB — COMPLETE METABOLIC PANEL WITH GFR
ALT: 14 U/L (ref 0–35)
AST: 17 U/L (ref 0–37)
Alkaline Phosphatase: 63 U/L (ref 39–117)
GFR, Est Non African American: 88 mL/min
Glucose, Bld: 120 mg/dL — ABNORMAL HIGH (ref 70–99)
Potassium: 3.8 mEq/L (ref 3.5–5.3)
Sodium: 142 mEq/L (ref 135–145)
Total Bilirubin: 1 mg/dL (ref 0.3–1.2)
Total Protein: 6.9 g/dL (ref 6.0–8.3)

## 2011-12-29 ENCOUNTER — Encounter: Payer: Self-pay | Admitting: *Deleted

## 2011-12-30 ENCOUNTER — Encounter: Payer: Self-pay | Admitting: Family Medicine

## 2012-01-23 ENCOUNTER — Encounter: Payer: Self-pay | Admitting: *Deleted

## 2012-01-27 ENCOUNTER — Encounter: Payer: Self-pay | Admitting: Family Medicine

## 2012-01-27 ENCOUNTER — Ambulatory Visit (INDEPENDENT_AMBULATORY_CARE_PROVIDER_SITE_OTHER): Payer: Medicare HMO | Admitting: Family Medicine

## 2012-01-27 VITALS — BP 155/76 | HR 65 | Ht 63.0 in | Wt 206.0 lb

## 2012-01-27 DIAGNOSIS — K219 Gastro-esophageal reflux disease without esophagitis: Secondary | ICD-10-CM

## 2012-01-27 DIAGNOSIS — R7309 Other abnormal glucose: Secondary | ICD-10-CM

## 2012-01-27 DIAGNOSIS — R739 Hyperglycemia, unspecified: Secondary | ICD-10-CM

## 2012-01-27 DIAGNOSIS — I10 Essential (primary) hypertension: Secondary | ICD-10-CM

## 2012-01-27 DIAGNOSIS — E785 Hyperlipidemia, unspecified: Secondary | ICD-10-CM

## 2012-01-27 LAB — POCT GLYCOSYLATED HEMOGLOBIN (HGB A1C): Hemoglobin A1C: 6.2

## 2012-01-27 MED ORDER — AMLODIPINE BESYLATE 10 MG PO TABS: 10.0000 mg | ORAL_TABLET | Freq: Every day | ORAL | Status: DC

## 2012-01-27 NOTE — Patient Instructions (Signed)

## 2012-01-27 NOTE — Progress Notes (Signed)
Subjec    Patient ID: Theresa [name], female    DOB: 1935-12-17, 76 y.o.   MRN: [id number]  HPI  Patient is here for followup of her hypertension and bradycardia. We have her on Avapro 300 Norvasc 5 and HCTZ 25. Blood pressure systolic has ranged from 140-155 range diastolic has remained under 80 but systolic basically has not come under as good as control as we like. The good news is she doesn't have the dizziness and lightheadedness that she had with the metoprolol indicating that that was the agent causing bradycardia.  #2 elevated blood sugar. She's had history of labile sugars before but we did not get a A1c on her last visit. She checked her blood sugars and they have ranged from 109 to 120 we'll see what the A1c looks like today.  #3 elevate cholesterol.tates that after this fall having a cholesterol almost at 300 she went back to doing Her old things to get her cholesterol down. Eating more oatmeal's and drinking a glass of wine a day which she really doesn't like but has been effective in the past and did get cholesterol down to 219 and an LDL of 137. The concern is if she is a true diabetic we need to get that LDL lower. #4 GERD. She reports she will have back pain. It is rare for her to have abdominal pain and when she did before they did a cardiovascular workup.      Review of Systems  Eyes: Positive for pain.  Respiratory: Negative for chest tightness and shortness of breath.   Genitourinary: Positive for frequency.  Neurological: Negative for dizziness and syncope.  All other systems reviewed and are negative.  t    BP 155/76  Pulse 65  Ht 5\' 3"  (1.6 m)  Wt 206 lb (93.441 kg)  BMI 36.49 kg/m2  SpO2 96% Objective:   Physical Exam  Constitutional: She is oriented to person, place, and time. She appears well-developed and well-nourished.  HENT:  Head: Normocephalic.  Eyes: Pupils are equal, round, and reactive to light.  Neck: Normal range of motion. Neck supple.    Cardiovascular: Normal rate, regular rhythm and normal heart sounds.   Pulmonary/Chest: Effort normal and breath sounds normal.  Neurological: She is alert and oriented to person, place, and time.  Skin: Skin is warm and dry.   Fall assessment redone. Risk of 7 age 32 points and medications 5 points leaving her at moderate risk Depression screen done and #3 bone staying asleep was given 0.1 low risk for depression.   Results for orders placed in visit on 01/27/12  POCT GLYCOSYLATED HEMOGLOBIN (HGB A1C)      Component Value Range   Hemoglobin A1C 6.2         Results for orders placed in visit on 12/25/11  POCT URINALYSIS DIPSTICK      Component Value Range   Color, UA yellow     Clarity, UA clear     Glucose, UA neg     Bilirubin, UA neg     Ketones, UA neg     Spec Grav, UA 1.025     Blood, UA neg     pH, UA 7.0     Protein, UA neg     Urobilinogen, UA 0.2     Nitrite, UA neg     Leukocytes, UA small (1+)    LIPID PANEL      Component Value Range   Cholesterol 219 (*) 0 - 200 (mg/dL)  Triglycerides 67  <150 (mg/dL)   HDL 69  >81 (mg/dL)   Total CHOL/HDL Ratio 3.2     VLDL 13  0 - 40 (mg/dL)   LDL Cholesterol 191 (*) 0 - 99 (mg/dL)  COMPLETE METABOLIC PANEL WITH GFR      Component Value Range   Sodium 142  135 - 145 (mEq/L)   Potassium 3.8  3.5 - 5.3 (mEq/L)   Chloride 103  96 - 112 (mEq/L)   CO2 31  19 - 32 (mEq/L)   Glucose, Bld 120 (*) 70 - 99 (mg/dL)   BUN 15  6 - 23 (mg/dL)   Creat 4.78  2.95 - 6.21 (mg/dL)   Total Bilirubin 1.0  0.3 - 1.2 (mg/dL)   Alkaline Phosphatase 63  39 - 117 (U/L)   AST 17  0 - 37 (U/L)   ALT 14  0 - 35 (U/L)   Total Protein 6.9  6.0 - 8.3 (g/dL)   Albumin 4.4  3.5 - 5.2 (g/dL)   Calcium 9.8  8.4 - 30.8 (mg/dL)   GFR, Est African American >89     GFR, Est Non African American 88    TSH      Component Value Range   TSH 1.610  0.350 - 4.500 (uIU/mL)  CBC WITH DIFFERENTIAL      Component Value Range   WBC 7.8  4.0 - 10.5 (K/uL)    RBC 4.00  3.87 - 5.11 (MIL/uL)   Hemoglobin 11.7 (*) 12.0 - 15.0 (g/dL)   HCT 65.7  84.6 - 96.2 (%)   MCV 93.5  78.0 - 100.0 (fL)   MCH 29.3  26.0 - 34.0 (pg)   MCHC 31.3  30.0 - 36.0 (g/dL)   RDW 95.2  84.1 - 32.4 (%)   Platelets 312  150 - 400 (K/uL)   Neutrophils Relative 61  43 - 77 (%)   Neutro Abs 4.7  1.7 - 7.7 (K/uL)   Lymphocytes Relative 32  12 - 46 (%)   Lymphs Abs 2.5  0.7 - 4.0 (K/uL)   Monocytes Relative 5  3 - 12 (%)   Monocytes Absolute 0.4  0.1 - 1.0 (K/uL)   Eosinophils Relative 1  0 - 5 (%)   Eosinophils Absolute 0.1  0.0 - 0.7 (K/uL)   Basophils Relative 1  0 - 1 (%)   Basophils Absolute 0.1  0.0 - 0.1 (K/uL)   Smear Review Criteria for review not met     Assessment & Plan:  #1 hypertension. We will increase her Norvasc from 5-10 mg the 5 mg she has now she may double up on those and make sure that his effective and tolerated by her and then she can get the prescription for the 10 mg.  #2 hyperlipidemia cholesterol greatly improved with diet and other modifications. But warned still not at goal at 139 and goal is 99 a lower. Also warned if diabetic we'll need to get those 75 or lower in medication will be needed.   #3 elevated blood sugar. A1c 6.2 indicating borderline diabetic and probable metabolic syndrome. Stressed to her about weight loss and diet control at this time she seems knowledgeable and is followed weight loss programs i.e. Weight Watchers before. It does seem that she may need to have genetic testing to evaluate her type of best diet plan and exercise workout since she does try to exercise at least 3 times a week at this time.  #4 health maintenance screening. Fall  assessment and depression evaluation was done last visit repeated again today. Risk factor was 7 moderate and depression 1 not a factor. #5 GERD. Samples of Nexium for 15 days given to patient to see if it helps her GERD. Asked her to let us know if she needs more after that or she can try  the OTC PPI and see if that will give her relief.  Return in 4 months for both reevaluation.

## 2012-03-09 ENCOUNTER — Other Ambulatory Visit: Payer: Self-pay | Admitting: *Deleted

## 2012-03-09 MED ORDER — AMBULATORY NON FORMULARY MEDICATION: Status: DC

## 2012-03-09 MED ORDER — IRBESARTAN 300 MG PO TABS: 300.0000 mg | ORAL_TABLET | Freq: Every day | ORAL | Status: DC

## 2012-03-09 MED ORDER — HYDROCHLOROTHIAZIDE 25 MG PO TABS: 25.0000 mg | ORAL_TABLET | Freq: Every day | ORAL | Status: DC

## 2012-04-13 ENCOUNTER — Other Ambulatory Visit: Payer: Self-pay | Admitting: *Deleted

## 2012-04-13 DIAGNOSIS — I10 Essential (primary) hypertension: Secondary | ICD-10-CM

## 2012-04-13 MED ORDER — AMLODIPINE BESYLATE 10 MG PO TABS: 10.0000 mg | ORAL_TABLET | Freq: Every day | ORAL | Status: DC

## 2012-06-01 ENCOUNTER — Ambulatory Visit (INDEPENDENT_AMBULATORY_CARE_PROVIDER_SITE_OTHER): Payer: Medicare HMO | Admitting: Family Medicine

## 2012-06-01 ENCOUNTER — Encounter: Payer: Self-pay | Admitting: Family Medicine

## 2012-06-01 VITALS — BP 131/69 | HR 70 | Ht 63.0 in | Wt 207.0 lb

## 2012-06-01 DIAGNOSIS — R109 Unspecified abdominal pain: Secondary | ICD-10-CM

## 2012-06-01 DIAGNOSIS — R7301 Impaired fasting glucose: Secondary | ICD-10-CM

## 2012-06-01 DIAGNOSIS — I10 Essential (primary) hypertension: Secondary | ICD-10-CM

## 2012-06-01 DIAGNOSIS — K2100 Gastro-esophageal reflux disease with esophagitis, without bleeding: Secondary | ICD-10-CM

## 2012-06-01 LAB — POCT GLYCOSYLATED HEMOGLOBIN (HGB A1C): Hemoglobin A1C: 6

## 2012-06-01 MED ORDER — AMLODIPINE BESYLATE 10 MG PO TABS: 10.0000 mg | ORAL_TABLET | Freq: Every day | ORAL | Status: DC

## 2012-06-01 MED ORDER — SUCRALFATE 1 G PO TABS: 2.0000 g | ORAL_TABLET | Freq: Two times a day (BID) | ORAL | Status: DC

## 2012-06-01 NOTE — Patient Instructions (Signed)
Gallbladder Disease  Gallbladder disease (cholecystitis) is an inflammation of your gallbladder. It is usually caused by a build-up of stones (gallstones) or sludge (cholelithiasis) in your gallbladder. The gallbladder is not an essential organ. It is located slightly to the right of center in the belly (abdomen), behind the liver. It stores bile made in the liver. Bile aids in digestion of fats. Gallbladder disease may result in nausea (feeling sick to your stomach), abdominal pain, and jaundice. In severe cases, emergency surgery may be required.  The most common type of gallbladder disease is gallstones. They begin as small crystals and slowly grow into stones. Gallstone pain occurs when the bile duct has spasms. The spasms are caused by the stone passing out of the duct. The stone is trying to pass at the same time bile is passing into the small bowel for digestion. The pain usually begins suddenly. It may persist from several minutes to several hours. Infection can occur. Infection can add to discomfort and severity of an acute attack. The pain may be made worse by breathing deeply or by being jarred. There may be fever and tenderness to the touch. In some cases, when gallstones do not move into the bile duct, people have no pain or symptoms. These are called "silent" gallstones.  Women are three times more likely to develop gallstones than men. Women who have had several pregnancies are more likely to have gallbladder disease. Physicians sometimes advise removing diseased gallbladders before future pregnancies. Other factors that increase the risk of gallbladder disease are obesity, diets heavy in fried foods and dairy products, increasing age, prolonged use of medications containing female hormones, and heredity.  HOME CARE INSTRUCTIONS    If your physician prescribed an antibiotic, take as directed.   Only take over-the-counter or prescription medicines for pain, discomfort, or fever as directed by your  caregiver.   Follow a low fat diet until seen again. (Fat causes the gallbladder to contract.)   Follow-up as instructed. Attacks are almost always recurrent and surgery is usually required for permanent treatment.  SEEK IMMEDIATE MEDICAL CARE IF:    Pain is increasing and not controlled by medications.   The pain moves to another part of your abdomen or to your back. (Right sided pain can be appendicitis and left sided pain in adults can be diverticulitis).   You have a fever.   You develop nausea and vomiting.  Document Released: 09/22/2005 Document Revised: 09/11/2011 Document Reviewed: 08/08/2011  [employer] Patient Information 2012 [employer].

## 2012-06-01 NOTE — Progress Notes (Signed)
  Subjective:    Patient ID: Theresa [name], female    DOB: [birth year], 76 y.o.   MRN: [id number]  HPI #1 hypertension. Patient reports there was some confusion with office in calling in her Norvasc 10 mg medication. She states that she initially was not to concern about taking increase Norvasc dosage until she saw that her blood pressure was started to go up. At that time she agreed to double up the Norvasc 5 mg and has noticed a marked improvement with a blood pressure. #2 elevated blood sugars. She's borderline diabetic or metabolic syndrome X. A1c will be obtained today. #3 hyperlipidemia. She was curious if she needs to have her cholesterol rechecked. #4 reflux disease /abdominal discomfort. Initially I thought patient was telling me that she had some back pain what she has had his pain that goes to her back and between her scapula. She has used the Nexium , Tagamet over-the-counter which she was concerned about drug interaction , and some natural supplements. Things worked initially but then the reflux has gotten worse she has had reflux before but this time this current one of reflux/abdominal discomfort has been progressively getting worse in 4 months since he was here last.  Review of Systems  Gastrointestinal: Positive for abdominal pain.       Reflux  All other systems reviewed and are negative.      BP 131/69  Pulse 70  Ht 5\' 3"  (1.6 m)  Wt 207 lb (93.895 kg)  BMI 36.67 kg/m2  SpO2 100% Objective:   Physical Exam  Constitutional: She appears well-developed and well-nourished.  HENT:  Head: Atraumatic.  Cardiovascular: Normal rate, regular rhythm and normal heart sounds.   Pulmonary/Chest: Effort normal and breath sounds normal.  Abdominal: Soft. Bowel sounds are normal. She exhibits no distension. There is no tenderness.  Neurological: She is alert.  Skin: Skin is warm and dry.  Psychiatric: She has a normal mood and affect. Her behavior is normal.    Lab Results    Component Value Date   CHOL 219* 12/25/2011   HDL 69 12/25/2011   LDLCALC 137* 12/25/2011   TRIG 67 12/25/2011   CHOLHDL 3.2 12/25/2011        Results for orders placed in visit on 06/01/12  POCT GLYCOSYLATED HEMOGLOBIN (HGB A1C)      Component Value Range   Hemoglobin A1C 6.0      Assessment & Plan:    #1 hypertension. Continue on Norvasc 10 mg will give her 90 of these tablets. #2 A1c is 6. Patient congratulated on great value. #3 hyperlipidemia. Continue with exercising and trying natural things to control her current cholesterol. Should be noted that patient is not taking any medication at this time. #4 abdominal discomfort/reflux disease. I am suspicious that this may be gallbladder disease. Will have her take Carafate 2 g twice a day before meals and will get ultrasound of the gallbladder and pancreas area to evaluate for stones. She was go to [facility] on [address]. Return in 4 months and at that time we'll probably recheck her lipids.

## 2012-06-03 ENCOUNTER — Ambulatory Visit
Admission: RE | Admit: 2012-06-03 | Discharge: 2012-06-03 | Disposition: A | Discharge status: Home / Self Care | Payer: Medicare HMO | Source: Ambulatory Visit | Attending: Family Medicine | Admitting: Family Medicine

## 2012-06-03 DIAGNOSIS — R109 Unspecified abdominal pain: Secondary | ICD-10-CM

## 2012-06-22 ENCOUNTER — Other Ambulatory Visit: Payer: Self-pay | Admitting: *Deleted

## 2012-06-22 DIAGNOSIS — I10 Essential (primary) hypertension: Secondary | ICD-10-CM

## 2012-06-24 ENCOUNTER — Other Ambulatory Visit: Payer: Self-pay | Admitting: Family Medicine

## 2012-08-30 ENCOUNTER — Other Ambulatory Visit: Payer: Self-pay | Admitting: *Deleted

## 2012-08-30 DIAGNOSIS — I10 Essential (primary) hypertension: Secondary | ICD-10-CM

## 2012-08-30 MED ORDER — AMLODIPINE BESYLATE 10 MG PO TABS: 10.0000 mg | ORAL_TABLET | Freq: Every day | ORAL | Status: DC

## 2012-10-05 ENCOUNTER — Ambulatory Visit: Payer: Medicare HMO | Admitting: Family Medicine

## 2013-02-16 ENCOUNTER — Other Ambulatory Visit: Payer: Self-pay | Admitting: Family Medicine

## 2013-02-16 ENCOUNTER — Other Ambulatory Visit: Payer: Self-pay

## 2013-02-16 DIAGNOSIS — Z1231 Encounter for screening mammogram for malignant neoplasm of breast: Secondary | ICD-10-CM

## 2013-03-23 ENCOUNTER — Ambulatory Visit
Admission: RE | Admit: 2013-03-23 | Discharge: 2013-03-23 | Disposition: A | Discharge status: Home / Self Care | Payer: Medicare Other | Source: Ambulatory Visit

## 2013-03-23 DIAGNOSIS — Z1231 Encounter for screening mammogram for malignant neoplasm of breast: Secondary | ICD-10-CM

## 2015-11-12 DIAGNOSIS — E119 Type 2 diabetes mellitus without complications: Secondary | ICD-10-CM | POA: Diagnosis not present

## 2015-12-18 DIAGNOSIS — E785 Hyperlipidemia, unspecified: Secondary | ICD-10-CM | POA: Diagnosis not present

## 2015-12-18 DIAGNOSIS — I10 Essential (primary) hypertension: Secondary | ICD-10-CM | POA: Diagnosis not present

## 2015-12-18 DIAGNOSIS — E041 Nontoxic single thyroid nodule: Secondary | ICD-10-CM | POA: Diagnosis not present

## 2015-12-18 DIAGNOSIS — M1990 Unspecified osteoarthritis, unspecified site: Secondary | ICD-10-CM | POA: Diagnosis not present

## 2015-12-18 DIAGNOSIS — E119 Type 2 diabetes mellitus without complications: Secondary | ICD-10-CM | POA: Diagnosis not present

## 2016-03-20 DIAGNOSIS — E119 Type 2 diabetes mellitus without complications: Secondary | ICD-10-CM | POA: Diagnosis not present

## 2016-03-20 DIAGNOSIS — E785 Hyperlipidemia, unspecified: Secondary | ICD-10-CM | POA: Diagnosis not present

## 2016-03-20 DIAGNOSIS — I10 Essential (primary) hypertension: Secondary | ICD-10-CM | POA: Diagnosis not present

## 2016-03-20 DIAGNOSIS — E559 Vitamin D deficiency, unspecified: Secondary | ICD-10-CM | POA: Diagnosis not present

## 2016-06-24 DIAGNOSIS — E785 Hyperlipidemia, unspecified: Secondary | ICD-10-CM | POA: Diagnosis not present

## 2016-06-24 DIAGNOSIS — E119 Type 2 diabetes mellitus without complications: Secondary | ICD-10-CM | POA: Diagnosis not present

## 2016-06-24 DIAGNOSIS — Z2821 Immunization not carried out because of patient refusal: Secondary | ICD-10-CM | POA: Diagnosis not present

## 2016-06-24 DIAGNOSIS — Z0000 Encounter for general adult medical examination without abnormal findings: Secondary | ICD-10-CM | POA: Diagnosis not present

## 2016-06-24 DIAGNOSIS — I10 Essential (primary) hypertension: Secondary | ICD-10-CM | POA: Diagnosis not present

## 2016-06-26 DIAGNOSIS — Z0000 Encounter for general adult medical examination without abnormal findings: Secondary | ICD-10-CM | POA: Diagnosis not present

## 2016-06-26 DIAGNOSIS — Z1231 Encounter for screening mammogram for malignant neoplasm of breast: Secondary | ICD-10-CM | POA: Diagnosis not present

## 2016-09-24 DIAGNOSIS — E119 Type 2 diabetes mellitus without complications: Secondary | ICD-10-CM | POA: Diagnosis not present

## 2016-09-24 DIAGNOSIS — I10 Essential (primary) hypertension: Secondary | ICD-10-CM | POA: Diagnosis not present

## 2016-09-24 DIAGNOSIS — Z2821 Immunization not carried out because of patient refusal: Secondary | ICD-10-CM | POA: Diagnosis not present

## 2016-09-24 DIAGNOSIS — E785 Hyperlipidemia, unspecified: Secondary | ICD-10-CM | POA: Diagnosis not present

## 2016-11-12 DIAGNOSIS — H25813 Combined forms of age-related cataract, bilateral: Secondary | ICD-10-CM | POA: Diagnosis not present

## 2016-11-12 DIAGNOSIS — E119 Type 2 diabetes mellitus without complications: Secondary | ICD-10-CM | POA: Diagnosis not present

## 2016-11-12 DIAGNOSIS — H5203 Hypermetropia, bilateral: Secondary | ICD-10-CM | POA: Diagnosis not present

## 2016-11-12 DIAGNOSIS — H524 Presbyopia: Secondary | ICD-10-CM | POA: Diagnosis not present

## 2016-12-23 DIAGNOSIS — E785 Hyperlipidemia, unspecified: Secondary | ICD-10-CM | POA: Diagnosis not present

## 2016-12-23 DIAGNOSIS — I10 Essential (primary) hypertension: Secondary | ICD-10-CM | POA: Diagnosis not present

## 2016-12-23 DIAGNOSIS — E119 Type 2 diabetes mellitus without complications: Secondary | ICD-10-CM | POA: Diagnosis not present

## 2017-03-26 DIAGNOSIS — E119 Type 2 diabetes mellitus without complications: Secondary | ICD-10-CM | POA: Diagnosis not present

## 2017-03-26 DIAGNOSIS — E785 Hyperlipidemia, unspecified: Secondary | ICD-10-CM | POA: Diagnosis not present

## 2017-03-26 DIAGNOSIS — I10 Essential (primary) hypertension: Secondary | ICD-10-CM | POA: Diagnosis not present

## 2017-06-30 DIAGNOSIS — R928 Other abnormal and inconclusive findings on diagnostic imaging of breast: Secondary | ICD-10-CM | POA: Diagnosis not present

## 2017-06-30 DIAGNOSIS — Z1231 Encounter for screening mammogram for malignant neoplasm of breast: Secondary | ICD-10-CM | POA: Diagnosis not present

## 2017-07-08 DIAGNOSIS — R928 Other abnormal and inconclusive findings on diagnostic imaging of breast: Secondary | ICD-10-CM | POA: Diagnosis not present

## 2017-07-08 DIAGNOSIS — N6489 Other specified disorders of breast: Secondary | ICD-10-CM | POA: Diagnosis not present

## 2017-08-05 DIAGNOSIS — Z0000 Encounter for general adult medical examination without abnormal findings: Secondary | ICD-10-CM | POA: Diagnosis not present

## 2017-08-05 DIAGNOSIS — I10 Essential (primary) hypertension: Secondary | ICD-10-CM | POA: Diagnosis not present

## 2017-08-05 DIAGNOSIS — E785 Hyperlipidemia, unspecified: Secondary | ICD-10-CM | POA: Diagnosis not present

## 2017-08-05 DIAGNOSIS — E119 Type 2 diabetes mellitus without complications: Secondary | ICD-10-CM | POA: Diagnosis not present

## 2017-08-06 DIAGNOSIS — E119 Type 2 diabetes mellitus without complications: Secondary | ICD-10-CM | POA: Diagnosis not present

## 2017-08-06 DIAGNOSIS — E785 Hyperlipidemia, unspecified: Secondary | ICD-10-CM | POA: Diagnosis not present

## 2017-08-06 DIAGNOSIS — Z0000 Encounter for general adult medical examination without abnormal findings: Secondary | ICD-10-CM | POA: Diagnosis not present

## 2017-08-07 DIAGNOSIS — D649 Anemia, unspecified: Secondary | ICD-10-CM | POA: Diagnosis not present

## 2017-08-12 DIAGNOSIS — D649 Anemia, unspecified: Secondary | ICD-10-CM | POA: Diagnosis not present

## 2017-08-12 DIAGNOSIS — R195 Other fecal abnormalities: Secondary | ICD-10-CM | POA: Insufficient documentation

## 2017-08-31 DIAGNOSIS — E876 Hypokalemia: Secondary | ICD-10-CM | POA: Diagnosis not present

## 2017-09-09 DIAGNOSIS — R195 Other fecal abnormalities: Secondary | ICD-10-CM | POA: Diagnosis not present

## 2017-09-09 DIAGNOSIS — D500 Iron deficiency anemia secondary to blood loss (chronic): Secondary | ICD-10-CM | POA: Diagnosis not present

## 2017-09-11 DIAGNOSIS — D500 Iron deficiency anemia secondary to blood loss (chronic): Secondary | ICD-10-CM | POA: Diagnosis not present

## 2017-09-11 DIAGNOSIS — K5730 Diverticulosis of large intestine without perforation or abscess without bleeding: Secondary | ICD-10-CM | POA: Diagnosis not present

## 2017-09-11 DIAGNOSIS — K319 Disease of stomach and duodenum, unspecified: Secondary | ICD-10-CM | POA: Diagnosis not present

## 2017-09-11 DIAGNOSIS — Z1211 Encounter for screening for malignant neoplasm of colon: Secondary | ICD-10-CM | POA: Diagnosis not present

## 2017-09-11 DIAGNOSIS — K648 Other hemorrhoids: Secondary | ICD-10-CM | POA: Diagnosis not present

## 2017-09-11 DIAGNOSIS — K3189 Other diseases of stomach and duodenum: Secondary | ICD-10-CM | POA: Diagnosis not present

## 2017-09-11 DIAGNOSIS — K641 Second degree hemorrhoids: Secondary | ICD-10-CM | POA: Diagnosis not present

## 2017-09-11 DIAGNOSIS — K449 Diaphragmatic hernia without obstruction or gangrene: Secondary | ICD-10-CM | POA: Diagnosis not present

## 2017-09-11 DIAGNOSIS — K2930 Chronic superficial gastritis without bleeding: Secondary | ICD-10-CM | POA: Diagnosis not present

## 2017-09-21 DIAGNOSIS — R05 Cough: Secondary | ICD-10-CM | POA: Diagnosis not present

## 2017-09-21 DIAGNOSIS — R5383 Other fatigue: Secondary | ICD-10-CM | POA: Diagnosis not present

## 2017-09-21 DIAGNOSIS — D649 Anemia, unspecified: Secondary | ICD-10-CM | POA: Diagnosis not present

## 2017-10-01 DIAGNOSIS — Z888 Allergy status to other drugs, medicaments and biological substances status: Secondary | ICD-10-CM | POA: Diagnosis not present

## 2017-10-01 DIAGNOSIS — D72829 Elevated white blood cell count, unspecified: Secondary | ICD-10-CM | POA: Diagnosis not present

## 2017-10-01 DIAGNOSIS — D473 Essential (hemorrhagic) thrombocythemia: Secondary | ICD-10-CM | POA: Diagnosis not present

## 2017-10-01 DIAGNOSIS — Z7982 Long term (current) use of aspirin: Secondary | ICD-10-CM | POA: Diagnosis not present

## 2017-10-01 DIAGNOSIS — D649 Anemia, unspecified: Secondary | ICD-10-CM | POA: Diagnosis not present

## 2017-10-01 DIAGNOSIS — E785 Hyperlipidemia, unspecified: Secondary | ICD-10-CM | POA: Diagnosis not present

## 2017-10-01 DIAGNOSIS — Z79899 Other long term (current) drug therapy: Secondary | ICD-10-CM | POA: Diagnosis not present

## 2017-10-01 DIAGNOSIS — Z880 Allergy status to penicillin: Secondary | ICD-10-CM | POA: Diagnosis not present

## 2017-10-01 DIAGNOSIS — I10 Essential (primary) hypertension: Secondary | ICD-10-CM | POA: Diagnosis not present

## 2017-10-09 DIAGNOSIS — D649 Anemia, unspecified: Secondary | ICD-10-CM | POA: Diagnosis not present

## 2017-10-09 DIAGNOSIS — D72829 Elevated white blood cell count, unspecified: Secondary | ICD-10-CM | POA: Diagnosis not present

## 2017-10-09 DIAGNOSIS — D473 Essential (hemorrhagic) thrombocythemia: Secondary | ICD-10-CM | POA: Diagnosis not present

## 2017-10-09 DIAGNOSIS — D696 Thrombocytopenia, unspecified: Secondary | ICD-10-CM | POA: Diagnosis not present

## 2017-10-13 DIAGNOSIS — N8110 Cystocele, unspecified: Secondary | ICD-10-CM | POA: Diagnosis not present

## 2017-10-23 DIAGNOSIS — D72829 Elevated white blood cell count, unspecified: Secondary | ICD-10-CM | POA: Diagnosis not present

## 2017-10-23 DIAGNOSIS — D649 Anemia, unspecified: Secondary | ICD-10-CM | POA: Diagnosis not present

## 2017-10-23 DIAGNOSIS — D473 Essential (hemorrhagic) thrombocythemia: Secondary | ICD-10-CM | POA: Diagnosis not present

## 2017-11-05 DIAGNOSIS — D649 Anemia, unspecified: Secondary | ICD-10-CM | POA: Diagnosis not present

## 2017-11-05 DIAGNOSIS — E119 Type 2 diabetes mellitus without complications: Secondary | ICD-10-CM | POA: Diagnosis not present

## 2017-11-05 DIAGNOSIS — E785 Hyperlipidemia, unspecified: Secondary | ICD-10-CM | POA: Diagnosis not present

## 2017-11-05 DIAGNOSIS — I10 Essential (primary) hypertension: Secondary | ICD-10-CM | POA: Diagnosis not present

## 2017-11-10 DIAGNOSIS — D649 Anemia, unspecified: Secondary | ICD-10-CM | POA: Diagnosis not present

## 2017-11-10 DIAGNOSIS — K2930 Chronic superficial gastritis without bleeding: Secondary | ICD-10-CM | POA: Diagnosis not present

## 2017-11-25 DIAGNOSIS — Z1151 Encounter for screening for human papillomavirus (HPV): Secondary | ICD-10-CM | POA: Diagnosis not present

## 2017-11-25 DIAGNOSIS — Z124 Encounter for screening for malignant neoplasm of cervix: Secondary | ICD-10-CM | POA: Diagnosis not present

## 2017-11-25 DIAGNOSIS — N813 Complete uterovaginal prolapse: Secondary | ICD-10-CM | POA: Diagnosis not present

## 2017-11-25 DIAGNOSIS — Z01419 Encounter for gynecological examination (general) (routine) without abnormal findings: Secondary | ICD-10-CM | POA: Diagnosis not present

## 2017-12-08 DIAGNOSIS — N8110 Cystocele, unspecified: Secondary | ICD-10-CM | POA: Diagnosis not present

## 2017-12-08 DIAGNOSIS — N812 Incomplete uterovaginal prolapse: Secondary | ICD-10-CM | POA: Diagnosis not present

## 2017-12-08 DIAGNOSIS — Z466 Encounter for fitting and adjustment of urinary device: Secondary | ICD-10-CM | POA: Diagnosis not present

## 2018-01-05 DIAGNOSIS — D649 Anemia, unspecified: Secondary | ICD-10-CM | POA: Diagnosis not present

## 2018-01-11 DIAGNOSIS — R922 Inconclusive mammogram: Secondary | ICD-10-CM | POA: Diagnosis not present

## 2018-01-11 DIAGNOSIS — Z87898 Personal history of other specified conditions: Secondary | ICD-10-CM | POA: Diagnosis not present

## 2018-01-11 DIAGNOSIS — D649 Anemia, unspecified: Secondary | ICD-10-CM | POA: Diagnosis not present

## 2018-01-11 DIAGNOSIS — Z09 Encounter for follow-up examination after completed treatment for conditions other than malignant neoplasm: Secondary | ICD-10-CM | POA: Diagnosis not present

## 2018-01-11 DIAGNOSIS — R928 Other abnormal and inconclusive findings on diagnostic imaging of breast: Secondary | ICD-10-CM | POA: Diagnosis not present

## 2018-01-12 DIAGNOSIS — D473 Essential (hemorrhagic) thrombocythemia: Secondary | ICD-10-CM | POA: Diagnosis not present

## 2018-01-12 DIAGNOSIS — D649 Anemia, unspecified: Secondary | ICD-10-CM | POA: Diagnosis not present

## 2018-01-12 DIAGNOSIS — D72829 Elevated white blood cell count, unspecified: Secondary | ICD-10-CM | POA: Diagnosis not present

## 2018-01-12 DIAGNOSIS — D696 Thrombocytopenia, unspecified: Secondary | ICD-10-CM | POA: Diagnosis not present

## 2018-02-02 DIAGNOSIS — N8110 Cystocele, unspecified: Secondary | ICD-10-CM | POA: Diagnosis not present

## 2018-02-02 DIAGNOSIS — N812 Incomplete uterovaginal prolapse: Secondary | ICD-10-CM | POA: Diagnosis not present

## 2018-02-02 DIAGNOSIS — R102 Pelvic and perineal pain: Secondary | ICD-10-CM | POA: Diagnosis not present

## 2018-02-03 DIAGNOSIS — I10 Essential (primary) hypertension: Secondary | ICD-10-CM | POA: Diagnosis not present

## 2018-02-03 DIAGNOSIS — E785 Hyperlipidemia, unspecified: Secondary | ICD-10-CM | POA: Diagnosis not present

## 2018-02-03 DIAGNOSIS — E119 Type 2 diabetes mellitus without complications: Secondary | ICD-10-CM | POA: Diagnosis not present

## 2018-02-09 DIAGNOSIS — J329 Chronic sinusitis, unspecified: Secondary | ICD-10-CM | POA: Diagnosis not present

## 2018-05-06 DIAGNOSIS — E119 Type 2 diabetes mellitus without complications: Secondary | ICD-10-CM | POA: Diagnosis not present

## 2018-05-06 DIAGNOSIS — E785 Hyperlipidemia, unspecified: Secondary | ICD-10-CM | POA: Diagnosis not present

## 2018-05-06 DIAGNOSIS — D649 Anemia, unspecified: Secondary | ICD-10-CM | POA: Diagnosis not present

## 2018-05-06 DIAGNOSIS — D72829 Elevated white blood cell count, unspecified: Secondary | ICD-10-CM | POA: Diagnosis not present

## 2018-05-06 DIAGNOSIS — I10 Essential (primary) hypertension: Secondary | ICD-10-CM | POA: Diagnosis not present

## 2018-05-06 DIAGNOSIS — D473 Essential (hemorrhagic) thrombocythemia: Secondary | ICD-10-CM | POA: Diagnosis not present

## 2018-05-12 DIAGNOSIS — N8110 Cystocele, unspecified: Secondary | ICD-10-CM | POA: Diagnosis not present

## 2018-05-12 DIAGNOSIS — R102 Pelvic and perineal pain: Secondary | ICD-10-CM | POA: Diagnosis not present

## 2018-05-12 DIAGNOSIS — N812 Incomplete uterovaginal prolapse: Secondary | ICD-10-CM | POA: Diagnosis not present

## 2018-07-01 DIAGNOSIS — Z1231 Encounter for screening mammogram for malignant neoplasm of breast: Secondary | ICD-10-CM | POA: Diagnosis not present

## 2018-07-07 DIAGNOSIS — D72829 Elevated white blood cell count, unspecified: Secondary | ICD-10-CM | POA: Diagnosis not present

## 2018-07-07 DIAGNOSIS — R899 Unspecified abnormal finding in specimens from other organs, systems and tissues: Secondary | ICD-10-CM | POA: Diagnosis not present

## 2018-07-07 DIAGNOSIS — D649 Anemia, unspecified: Secondary | ICD-10-CM | POA: Diagnosis not present

## 2018-07-07 DIAGNOSIS — D473 Essential (hemorrhagic) thrombocythemia: Secondary | ICD-10-CM | POA: Diagnosis not present

## 2018-07-27 DIAGNOSIS — N859 Noninflammatory disorder of uterus, unspecified: Secondary | ICD-10-CM | POA: Diagnosis not present

## 2018-07-27 DIAGNOSIS — N812 Incomplete uterovaginal prolapse: Secondary | ICD-10-CM | POA: Diagnosis not present

## 2018-07-27 DIAGNOSIS — N950 Postmenopausal bleeding: Secondary | ICD-10-CM | POA: Diagnosis not present

## 2018-09-07 DIAGNOSIS — D473 Essential (hemorrhagic) thrombocythemia: Secondary | ICD-10-CM | POA: Diagnosis not present

## 2018-09-07 DIAGNOSIS — E785 Hyperlipidemia, unspecified: Secondary | ICD-10-CM | POA: Diagnosis not present

## 2018-09-07 DIAGNOSIS — E119 Type 2 diabetes mellitus without complications: Secondary | ICD-10-CM | POA: Diagnosis not present

## 2018-09-07 DIAGNOSIS — I10 Essential (primary) hypertension: Secondary | ICD-10-CM | POA: Diagnosis not present

## 2018-11-08 DIAGNOSIS — I10 Essential (primary) hypertension: Secondary | ICD-10-CM | POA: Diagnosis not present

## 2018-11-08 DIAGNOSIS — Z780 Asymptomatic menopausal state: Secondary | ICD-10-CM | POA: Diagnosis not present

## 2018-11-08 DIAGNOSIS — E119 Type 2 diabetes mellitus without complications: Secondary | ICD-10-CM | POA: Diagnosis not present

## 2018-11-08 DIAGNOSIS — Z13820 Encounter for screening for osteoporosis: Secondary | ICD-10-CM | POA: Diagnosis not present

## 2018-11-08 DIAGNOSIS — Z2821 Immunization not carried out because of patient refusal: Secondary | ICD-10-CM | POA: Diagnosis not present

## 2018-11-08 DIAGNOSIS — Z0000 Encounter for general adult medical examination without abnormal findings: Secondary | ICD-10-CM | POA: Diagnosis not present

## 2018-11-08 DIAGNOSIS — E785 Hyperlipidemia, unspecified: Secondary | ICD-10-CM | POA: Diagnosis not present

## 2018-11-12 DIAGNOSIS — M8580 Other specified disorders of bone density and structure, unspecified site: Secondary | ICD-10-CM | POA: Diagnosis not present

## 2018-11-12 DIAGNOSIS — Z780 Asymptomatic menopausal state: Secondary | ICD-10-CM | POA: Diagnosis not present

## 2018-11-12 DIAGNOSIS — M85852 Other specified disorders of bone density and structure, left thigh: Secondary | ICD-10-CM | POA: Diagnosis not present

## 2018-11-12 DIAGNOSIS — Z13820 Encounter for screening for osteoporosis: Secondary | ICD-10-CM | POA: Diagnosis not present

## 2018-11-16 DIAGNOSIS — N814 Uterovaginal prolapse, unspecified: Secondary | ICD-10-CM | POA: Diagnosis not present

## 2018-11-16 DIAGNOSIS — N950 Postmenopausal bleeding: Secondary | ICD-10-CM | POA: Diagnosis not present

## 2019-05-09 DIAGNOSIS — R197 Diarrhea, unspecified: Secondary | ICD-10-CM | POA: Diagnosis not present

## 2019-05-09 DIAGNOSIS — E119 Type 2 diabetes mellitus without complications: Secondary | ICD-10-CM | POA: Diagnosis not present

## 2019-05-09 DIAGNOSIS — K649 Unspecified hemorrhoids: Secondary | ICD-10-CM | POA: Diagnosis not present

## 2019-05-09 DIAGNOSIS — I10 Essential (primary) hypertension: Secondary | ICD-10-CM | POA: Diagnosis not present

## 2019-05-09 DIAGNOSIS — E785 Hyperlipidemia, unspecified: Secondary | ICD-10-CM | POA: Diagnosis not present

## 2019-05-09 DIAGNOSIS — M8580 Other specified disorders of bone density and structure, unspecified site: Secondary | ICD-10-CM | POA: Diagnosis not present

## 2019-05-09 DIAGNOSIS — D638 Anemia in other chronic diseases classified elsewhere: Secondary | ICD-10-CM | POA: Diagnosis not present

## 2019-05-09 DIAGNOSIS — D649 Anemia, unspecified: Secondary | ICD-10-CM | POA: Diagnosis not present

## 2019-05-10 DIAGNOSIS — R197 Diarrhea, unspecified: Secondary | ICD-10-CM | POA: Diagnosis not present

## 2019-05-11 DIAGNOSIS — D649 Anemia, unspecified: Secondary | ICD-10-CM | POA: Diagnosis not present

## 2019-05-11 DIAGNOSIS — D638 Anemia in other chronic diseases classified elsewhere: Secondary | ICD-10-CM | POA: Diagnosis not present

## 2019-05-11 DIAGNOSIS — Z79899 Other long term (current) drug therapy: Secondary | ICD-10-CM | POA: Diagnosis not present

## 2019-05-16 DIAGNOSIS — E876 Hypokalemia: Secondary | ICD-10-CM | POA: Diagnosis not present

## 2019-05-16 DIAGNOSIS — D649 Anemia, unspecified: Secondary | ICD-10-CM | POA: Diagnosis not present

## 2019-05-17 DIAGNOSIS — Z01419 Encounter for gynecological examination (general) (routine) without abnormal findings: Secondary | ICD-10-CM | POA: Diagnosis not present

## 2019-05-17 DIAGNOSIS — N814 Uterovaginal prolapse, unspecified: Secondary | ICD-10-CM | POA: Diagnosis not present

## 2019-05-18 DIAGNOSIS — E119 Type 2 diabetes mellitus without complications: Secondary | ICD-10-CM | POA: Diagnosis not present

## 2019-05-18 DIAGNOSIS — H25013 Cortical age-related cataract, bilateral: Secondary | ICD-10-CM | POA: Diagnosis not present

## 2019-05-18 DIAGNOSIS — H2513 Age-related nuclear cataract, bilateral: Secondary | ICD-10-CM | POA: Diagnosis not present

## 2019-05-27 DIAGNOSIS — D649 Anemia, unspecified: Secondary | ICD-10-CM | POA: Diagnosis not present

## 2019-05-27 DIAGNOSIS — M2550 Pain in unspecified joint: Secondary | ICD-10-CM | POA: Diagnosis not present

## 2019-05-27 DIAGNOSIS — L298 Other pruritus: Secondary | ICD-10-CM | POA: Diagnosis not present

## 2019-05-27 DIAGNOSIS — D72819 Decreased white blood cell count, unspecified: Secondary | ICD-10-CM | POA: Diagnosis not present

## 2019-05-27 DIAGNOSIS — R195 Other fecal abnormalities: Secondary | ICD-10-CM | POA: Diagnosis not present

## 2019-05-27 DIAGNOSIS — M791 Myalgia, unspecified site: Secondary | ICD-10-CM | POA: Diagnosis not present

## 2019-05-27 DIAGNOSIS — R21 Rash and other nonspecific skin eruption: Secondary | ICD-10-CM | POA: Diagnosis not present

## 2019-05-27 DIAGNOSIS — R531 Weakness: Secondary | ICD-10-CM | POA: Diagnosis not present

## 2019-05-27 DIAGNOSIS — R5383 Other fatigue: Secondary | ICD-10-CM | POA: Diagnosis not present

## 2019-05-27 DIAGNOSIS — R768 Other specified abnormal immunological findings in serum: Secondary | ICD-10-CM | POA: Diagnosis not present

## 2019-05-30 DIAGNOSIS — R21 Rash and other nonspecific skin eruption: Secondary | ICD-10-CM | POA: Diagnosis not present

## 2019-05-30 DIAGNOSIS — M2550 Pain in unspecified joint: Secondary | ICD-10-CM | POA: Diagnosis not present

## 2019-06-02 DIAGNOSIS — L308 Other specified dermatitis: Secondary | ICD-10-CM | POA: Diagnosis not present

## 2019-06-02 DIAGNOSIS — R7989 Other specified abnormal findings of blood chemistry: Secondary | ICD-10-CM | POA: Diagnosis not present

## 2019-06-02 DIAGNOSIS — R748 Abnormal levels of other serum enzymes: Secondary | ICD-10-CM | POA: Diagnosis not present

## 2019-06-02 DIAGNOSIS — R21 Rash and other nonspecific skin eruption: Secondary | ICD-10-CM | POA: Diagnosis not present

## 2019-06-02 DIAGNOSIS — L988 Other specified disorders of the skin and subcutaneous tissue: Secondary | ICD-10-CM | POA: Diagnosis not present

## 2019-06-14 DIAGNOSIS — Z4802 Encounter for removal of sutures: Secondary | ICD-10-CM | POA: Diagnosis not present

## 2019-06-14 DIAGNOSIS — R21 Rash and other nonspecific skin eruption: Secondary | ICD-10-CM | POA: Diagnosis not present

## 2019-06-14 DIAGNOSIS — E876 Hypokalemia: Secondary | ICD-10-CM | POA: Diagnosis not present

## 2019-06-14 DIAGNOSIS — Z9889 Other specified postprocedural states: Secondary | ICD-10-CM | POA: Diagnosis not present

## 2019-06-14 DIAGNOSIS — D649 Anemia, unspecified: Secondary | ICD-10-CM | POA: Diagnosis not present

## 2019-06-21 DIAGNOSIS — D649 Anemia, unspecified: Secondary | ICD-10-CM | POA: Diagnosis not present

## 2019-06-21 DIAGNOSIS — E876 Hypokalemia: Secondary | ICD-10-CM | POA: Diagnosis not present

## 2019-07-04 DIAGNOSIS — E119 Type 2 diabetes mellitus without complications: Secondary | ICD-10-CM | POA: Diagnosis not present

## 2019-07-04 DIAGNOSIS — M2550 Pain in unspecified joint: Secondary | ICD-10-CM | POA: Diagnosis not present

## 2019-07-04 DIAGNOSIS — R21 Rash and other nonspecific skin eruption: Secondary | ICD-10-CM | POA: Diagnosis not present

## 2019-08-09 DIAGNOSIS — E119 Type 2 diabetes mellitus without complications: Secondary | ICD-10-CM | POA: Diagnosis not present

## 2019-08-09 DIAGNOSIS — R21 Rash and other nonspecific skin eruption: Secondary | ICD-10-CM | POA: Diagnosis not present

## 2019-08-09 DIAGNOSIS — E785 Hyperlipidemia, unspecified: Secondary | ICD-10-CM | POA: Diagnosis not present

## 2019-08-09 DIAGNOSIS — I10 Essential (primary) hypertension: Secondary | ICD-10-CM | POA: Diagnosis not present

## 2019-08-09 DIAGNOSIS — D638 Anemia in other chronic diseases classified elsewhere: Secondary | ICD-10-CM | POA: Diagnosis not present

## 2019-08-09 DIAGNOSIS — Z2821 Immunization not carried out because of patient refusal: Secondary | ICD-10-CM | POA: Diagnosis not present

## 2019-08-09 DIAGNOSIS — M3390 Dermatopolymyositis, unspecified, organ involvement unspecified: Secondary | ICD-10-CM | POA: Diagnosis not present

## 2019-08-09 DIAGNOSIS — Z23 Encounter for immunization: Secondary | ICD-10-CM | POA: Diagnosis not present

## 2019-08-09 DIAGNOSIS — E559 Vitamin D deficiency, unspecified: Secondary | ICD-10-CM | POA: Diagnosis not present

## 2019-08-20 DIAGNOSIS — J029 Acute pharyngitis, unspecified: Secondary | ICD-10-CM | POA: Diagnosis not present

## 2019-08-20 DIAGNOSIS — J019 Acute sinusitis, unspecified: Secondary | ICD-10-CM | POA: Diagnosis not present

## 2019-08-31 DIAGNOSIS — M329 Systemic lupus erythematosus, unspecified: Secondary | ICD-10-CM | POA: Diagnosis not present

## 2019-09-07 DIAGNOSIS — M329 Systemic lupus erythematosus, unspecified: Secondary | ICD-10-CM | POA: Diagnosis not present

## 2022-01-22 ENCOUNTER — Other Ambulatory Visit: Payer: Self-pay | Admitting: Infectious Diseases

## 2022-01-22 DIAGNOSIS — Z1231 Encounter for screening mammogram for malignant neoplasm of breast: Secondary | ICD-10-CM

## 2022-02-25 ENCOUNTER — Ambulatory Visit
Admission: RE | Admit: 2022-02-25 | Discharge: 2022-02-25 | Disposition: A | Discharge status: Home / Self Care | Payer: Medicare Other | Source: Ambulatory Visit | Attending: Infectious Diseases | Admitting: Infectious Diseases

## 2022-02-25 DIAGNOSIS — Z1231 Encounter for screening mammogram for malignant neoplasm of breast: Secondary | ICD-10-CM | POA: Diagnosis present

## 2023-01-28 ENCOUNTER — Other Ambulatory Visit: Payer: Self-pay | Admitting: Infectious Diseases

## 2023-01-28 DIAGNOSIS — Z1231 Encounter for screening mammogram for malignant neoplasm of breast: Secondary | ICD-10-CM

## 2023-03-03 ENCOUNTER — Ambulatory Visit
Admission: RE | Admit: 2023-03-03 | Discharge: 2023-03-03 | Disposition: A | Discharge status: Home / Self Care | Payer: Medicare Other | Source: Ambulatory Visit | Attending: Infectious Diseases | Admitting: Infectious Diseases

## 2023-03-03 DIAGNOSIS — Z1231 Encounter for screening mammogram for malignant neoplasm of breast: Secondary | ICD-10-CM | POA: Diagnosis present

## 2023-03-18 ENCOUNTER — Encounter: Payer: Self-pay | Admitting: Surgery

## 2023-03-25 ENCOUNTER — Ambulatory Visit: Payer: Medicare Other | Admitting: Certified Registered"

## 2023-03-25 ENCOUNTER — Ambulatory Visit
Admission: RE | Admit: 2023-03-25 | Discharge: 2023-03-25 | Disposition: A | Discharge status: Home / Self Care | Payer: Medicare Other | Source: Home / Self Care | Attending: Surgery | Admitting: Surgery

## 2023-03-25 ENCOUNTER — Other Ambulatory Visit: Payer: Self-pay

## 2023-03-25 ENCOUNTER — Encounter
Admission: RE | Disposition: A | Discharge status: Home / Self Care | Payer: Self-pay | Source: Home / Self Care | Attending: Surgery

## 2023-03-25 ENCOUNTER — Encounter: Payer: Self-pay | Admitting: Surgery

## 2023-03-25 DIAGNOSIS — Z79899 Other long term (current) drug therapy: Secondary | ICD-10-CM | POA: Insufficient documentation

## 2023-03-25 DIAGNOSIS — Z539 Procedure and treatment not carried out, unspecified reason: Secondary | ICD-10-CM | POA: Insufficient documentation

## 2023-03-25 DIAGNOSIS — R197 Diarrhea, unspecified: Secondary | ICD-10-CM | POA: Insufficient documentation

## 2023-03-25 DIAGNOSIS — E876 Hypokalemia: Secondary | ICD-10-CM | POA: Diagnosis not present

## 2023-03-25 DIAGNOSIS — I10 Essential (primary) hypertension: Secondary | ICD-10-CM

## 2023-03-25 DIAGNOSIS — K644 Residual hemorrhoidal skin tags: Secondary | ICD-10-CM | POA: Insufficient documentation

## 2023-03-25 HISTORY — DX: Anemia, unspecified: D64.9

## 2023-03-25 HISTORY — DX: Unspecified hemorrhoids: K64.9

## 2023-03-25 HISTORY — PX: COLONOSCOPY WITH PROPOFOL: SHX5780

## 2023-03-25 SURGERY — COLONOSCOPY WITH PROPOFOL
Anesthesia: General

## 2023-03-25 MED ORDER — SODIUM CHLORIDE 0.9 % IV SOLN: INTRAVENOUS | Status: DC

## 2023-03-25 NOTE — H&P (Signed)
Subjective: CC: Diarrhea, unspecified type [R19.7]  HPI: Theresa [name] is a 87 y.o. female who was referrred by Jasmine December Fitzgeral* for above. Symptoms were first noted 1 year ago.  Recently noted to have elevated calprotectin as well.  Toilet habits: 2-3 BM a day/loose, increased mucus, no blood. On "special GI" diet, associated with cramping. Exacerbated if off her special diet.  Last colonoscopy 68yrs ago.  Past Medical History: has a past medical history of Anemia, Arthritis, History of cataract, Hyperlipidemia, and Hypertension.  Past Surgical History: has a past surgical history that includes Tubal ligation and Tonsillectomy.  Family History: family history includes Diabetes in her brother; Heart disease in her brother; High blood pressure (Hypertension) in her brother and sister; Osteoarthritis in her brother and sister; Osteoporosis (Thinning of bones) in her sister.  Social History: reports that she has never smoked. She has never used smokeless tobacco. She reports that she does not drink alcohol and does not use drugs.  Current Medications: has a current medication list which includes the following prescription(s): blood glucose diagnostic, collagen, liposomal ubiquinol, guaifenesin, hydrochlorothiazide, irbesartan, lutein, magnesium oxide, multivitamin, potassium chloride, ergocalciferol (vitamin d2), and zinc.  Allergies: Allergies as of 03/10/2023 - Reviewed 03/10/2023 Allergen Reaction Noted Januvia [sitagliptin] Nausea 12/03/2020  ROS: A 15 point review of systems was performed and pertinent positives and negatives noted in HPI  Objective:   BP 119/63  Pulse 70  Ht 160 cm (5\' 3" )  Wt 74.4 kg (164 lb)  SpO2 98%  BMI 29.05 kg/m  Constitutional : No distress, cooperative, alert Lymphatics/Throat: Supple with no lymphadenopathy Respiratory: Clear to auscultation bilaterally Cardiovascular: Regular rate and rhythm Gastrointestinal: Soft, non-tender,  non-distended, no organomegaly. Musculoskeletal: Steady gait and movement Skin: Cool and moist, surgical scars Psychiatric: Normal affect, non-agitated, not confused Rectal: Chaperone present for exam. External exam noted to have several external hemorrhoids, with one larger one that is non-tender, no ulceration. DRE revealed, decreased rectal tone, with palpable internal hemorrhoid noted at anterior portion with minimal discomfort. Ansocopy referred due to discomfort associated with DRE.   LABS: N/a  RADS: Previous plain films noted large stool burden in colon Assessment:   Diarrhea, unspecified type [R19.7] Elevated calprotectin  Long discuss of r/b/a below. Pt states she wishes to proceed with colonoscopy for further eval, since she cannot continue to worry about having diarrhea.  External hemorrhoids- stable today, no need for any surgical excision. Stop preparation H due to some skin atrophy noted.  Plan:  1. Diarrhea, unspecified type [R19.7] R/b/a discussed. Risks include bleeding, perforation. Benefits include diagnostic, curative procedure if needed. Alternatives include continued observation. Pt verbalized understanding.  2. Patient has elected to proceed with surgical treatment. Procedure will be scheduled.

## 2023-03-25 NOTE — Progress Notes (Signed)
Patient cancelled per anesthesia due to low heart rate and abnormal EKG. She will follow up with cardiology and [facility] and will need to reschedule her colonoscopy.

## 2023-03-26 ENCOUNTER — Encounter: Payer: Self-pay | Admitting: Surgery

## 2023-03-27 ENCOUNTER — Inpatient Hospital Stay
Admission: EM | Admit: 2023-03-27 | Discharge: 2023-03-30 | DRG: 641 | Disposition: A | Discharge status: Home / Self Care | Payer: Medicare Other | Attending: Student | Admitting: Student

## 2023-03-27 ENCOUNTER — Other Ambulatory Visit: Payer: Self-pay

## 2023-03-27 DIAGNOSIS — Z8262 Family history of osteoporosis: Secondary | ICD-10-CM

## 2023-03-27 DIAGNOSIS — I10 Essential (primary) hypertension: Secondary | ICD-10-CM | POA: Diagnosis present

## 2023-03-27 DIAGNOSIS — I498 Other specified cardiac arrhythmias: Secondary | ICD-10-CM

## 2023-03-27 DIAGNOSIS — Z79899 Other long term (current) drug therapy: Secondary | ICD-10-CM

## 2023-03-27 DIAGNOSIS — E785 Hyperlipidemia, unspecified: Secondary | ICD-10-CM | POA: Diagnosis present

## 2023-03-27 DIAGNOSIS — I110 Hypertensive heart disease with heart failure: Secondary | ICD-10-CM | POA: Diagnosis present

## 2023-03-27 DIAGNOSIS — R008 Other abnormalities of heart beat: Secondary | ICD-10-CM | POA: Diagnosis present

## 2023-03-27 DIAGNOSIS — I5020 Unspecified systolic (congestive) heart failure: Secondary | ICD-10-CM | POA: Diagnosis present

## 2023-03-27 DIAGNOSIS — R42 Dizziness and giddiness: Secondary | ICD-10-CM

## 2023-03-27 DIAGNOSIS — D649 Anemia, unspecified: Secondary | ICD-10-CM | POA: Diagnosis present

## 2023-03-27 DIAGNOSIS — E119 Type 2 diabetes mellitus without complications: Secondary | ICD-10-CM | POA: Diagnosis present

## 2023-03-27 DIAGNOSIS — Z833 Family history of diabetes mellitus: Secondary | ICD-10-CM

## 2023-03-27 DIAGNOSIS — K644 Residual hemorrhoidal skin tags: Secondary | ICD-10-CM | POA: Diagnosis present

## 2023-03-27 DIAGNOSIS — R001 Bradycardia, unspecified: Secondary | ICD-10-CM | POA: Diagnosis present

## 2023-03-27 DIAGNOSIS — E0590 Thyrotoxicosis, unspecified without thyrotoxic crisis or storm: Secondary | ICD-10-CM | POA: Diagnosis present

## 2023-03-27 DIAGNOSIS — L909 Atrophic disorder of skin, unspecified: Secondary | ICD-10-CM | POA: Diagnosis not present

## 2023-03-27 DIAGNOSIS — E876 Hypokalemia: Principal | ICD-10-CM | POA: Diagnosis present

## 2023-03-27 DIAGNOSIS — Z8261 Family history of arthritis: Secondary | ICD-10-CM

## 2023-03-27 DIAGNOSIS — E871 Hypo-osmolality and hyponatremia: Secondary | ICD-10-CM | POA: Diagnosis present

## 2023-03-27 DIAGNOSIS — R531 Weakness: Secondary | ICD-10-CM | POA: Diagnosis not present

## 2023-03-27 DIAGNOSIS — R197 Diarrhea, unspecified: Secondary | ICD-10-CM | POA: Diagnosis present

## 2023-03-27 DIAGNOSIS — Z888 Allergy status to other drugs, medicaments and biological substances status: Secondary | ICD-10-CM

## 2023-03-27 DIAGNOSIS — E669 Obesity, unspecified: Secondary | ICD-10-CM | POA: Diagnosis present

## 2023-03-27 DIAGNOSIS — Z8249 Family history of ischemic heart disease and other diseases of the circulatory system: Secondary | ICD-10-CM

## 2023-03-27 DIAGNOSIS — D638 Anemia in other chronic diseases classified elsewhere: Secondary | ICD-10-CM | POA: Diagnosis present

## 2023-03-27 LAB — BRAIN NATRIURETIC PEPTIDE: B Natriuretic Peptide: 696.2 pg/mL — ABNORMAL HIGH (ref 0.0–100.0)

## 2023-03-27 LAB — CBC
HCT: 32.9 % — ABNORMAL LOW (ref 36.0–46.0)
Hemoglobin: 10.7 g/dL — ABNORMAL LOW (ref 12.0–15.0)
MCH: 29.8 pg (ref 26.0–34.0)
MCHC: 32.5 g/dL (ref 30.0–36.0)
MCV: 91.6 fL (ref 80.0–100.0)
Platelets: 309 10*3/uL (ref 150–400)
RBC: 3.59 MIL/uL — ABNORMAL LOW (ref 3.87–5.11)
RDW: 12.8 % (ref 11.5–15.5)
WBC: 7 10*3/uL (ref 4.0–10.5)
nRBC: 0 % (ref 0.0–0.2)

## 2023-03-27 LAB — HEPATIC FUNCTION PANEL
ALT: 14 U/L (ref 0–44)
AST: 27 U/L (ref 15–41)
Albumin: 3.7 g/dL (ref 3.5–5.0)
Alkaline Phosphatase: 50 U/L (ref 38–126)
Bilirubin, Direct: 0.2 mg/dL (ref 0.0–0.2)
Indirect Bilirubin: 1 mg/dL — ABNORMAL HIGH (ref 0.3–0.9)
Total Bilirubin: 1.2 mg/dL (ref 0.3–1.2)
Total Protein: 7.1 g/dL (ref 6.5–8.1)

## 2023-03-27 LAB — URINALYSIS, ROUTINE W REFLEX MICROSCOPIC
Bilirubin Urine: NEGATIVE
Glucose, UA: NEGATIVE mg/dL
Hgb urine dipstick: NEGATIVE
Ketones, ur: NEGATIVE mg/dL
Leukocytes,Ua: NEGATIVE
Nitrite: NEGATIVE
Protein, ur: NEGATIVE mg/dL
Specific Gravity, Urine: 1.003 — ABNORMAL LOW (ref 1.005–1.030)
pH: 7 (ref 5.0–8.0)

## 2023-03-27 LAB — BASIC METABOLIC PANEL
Anion gap: 11 (ref 5–15)
BUN: 14 mg/dL (ref 8–23)
CO2: 33 mmol/L — ABNORMAL HIGH (ref 22–32)
Calcium: 9.6 mg/dL (ref 8.9–10.3)
Chloride: 89 mmol/L — ABNORMAL LOW (ref 98–111)
Creatinine, Ser: 0.64 mg/dL (ref 0.44–1.00)
GFR, Estimated: >60 mL/min (ref >60)
Glucose, Bld: 108 mg/dL — ABNORMAL HIGH (ref 70–99)
Potassium: <2 mmol/L — CL (ref 3.5–5.1)
Sodium: 133 mmol/L — ABNORMAL LOW (ref 135–145)

## 2023-03-27 LAB — PHOSPHORUS: Phosphorus: 3.5 mg/dL (ref 2.5–4.6)

## 2023-03-27 LAB — MAGNESIUM: Magnesium: 2.2 mg/dL (ref 1.7–2.4)

## 2023-03-27 LAB — TYPE AND SCREEN
ABO/RH(D): O NEG
Antibody Screen: NEGATIVE

## 2023-03-27 LAB — T4, FREE: Free T4: 1.57 ng/dL — ABNORMAL HIGH (ref 0.61–1.12)

## 2023-03-27 LAB — TSH: TSH: 1.777 u[IU]/mL (ref 0.350–4.500)

## 2023-03-27 MED ORDER — ACETAMINOPHEN 650 MG RE SUPP: 650.0000 mg | Freq: Four times a day (QID) | RECTAL | Status: DC | PRN

## 2023-03-27 MED ORDER — ACETAMINOPHEN 325 MG PO TABS: 650.0000 mg | ORAL_TABLET | Freq: Four times a day (QID) | ORAL | Status: DC | PRN

## 2023-03-27 MED ORDER — HYDRALAZINE HCL 20 MG/ML IJ SOLN: 5.0000 mg | INTRAMUSCULAR | Status: DC | PRN

## 2023-03-27 MED ORDER — IRBESARTAN 150 MG PO TABS
150.0000 mg | ORAL_TABLET | Freq: Every day | ORAL | Status: DC
  Administered 2023-03-28 – 2023-03-30 (×3): 150 mg via ORAL
  Filled 2023-03-27 (×4): qty 1

## 2023-03-27 MED ORDER — POTASSIUM CHLORIDE CRYS ER 20 MEQ PO TBCR
40.0000 meq | EXTENDED_RELEASE_TABLET | Freq: Once | ORAL | Status: AC
  Administered 2023-03-27: 40 meq via ORAL
  Filled 2023-03-27: qty 2

## 2023-03-27 MED ORDER — HEPARIN SODIUM (PORCINE) 5000 UNIT/ML IJ SOLN
5000.0000 [IU] | Freq: Three times a day (TID) | INTRAMUSCULAR | Status: DC
  Filled 2023-03-27 (×5): qty 1

## 2023-03-27 MED ORDER — KCL IN DEXTROSE-NACL 40-5-0.9 MEQ/L-%-% IV SOLN: INTRAVENOUS | Status: DC

## 2023-03-27 MED ORDER — POTASSIUM CHLORIDE 10 MEQ/100ML IV SOLN
10.0000 meq | INTRAVENOUS | Status: AC
  Administered 2023-03-27 (×3): 10 meq via INTRAVENOUS
  Filled 2023-03-27: qty 100

## 2023-03-27 MED ORDER — SODIUM CHLORIDE 0.9% FLUSH
3.0000 mL | Freq: Two times a day (BID) | INTRAVENOUS | Status: DC
  Administered 2023-03-28 – 2023-03-30 (×4): 3 mL via INTRAVENOUS

## 2023-03-27 MED ORDER — POTASSIUM CHLORIDE 10 MEQ/100ML IV SOLN
10.0000 meq | INTRAVENOUS | Status: AC
  Administered 2023-03-27: 10 meq via INTRAVENOUS
  Filled 2023-03-27 (×2): qty 100

## 2023-03-27 MED ORDER — ASPIRIN 81 MG PO TBEC
81.0000 mg | DELAYED_RELEASE_TABLET | Freq: Every day | ORAL | Status: DC
  Filled 2023-03-27: qty 1

## 2023-03-27 NOTE — ED Provider Notes (Signed)
[facility] Provider Note    Event Date/Time   First MD Initiated Contact with Patient 03/27/23 1542     (approximate)   History   Dizziness   HPI  Theresa [name] is a 87 y.o. female history of hypertension diabetes   Patient been feeling weak and somewhat dizzy today.  She has not been feeling quite herself, went and saw her primary care doctor and was told to come to the hospital due to concerns were very low potassium level.     Physical Exam   Triage Vital Signs: ED Triage Vitals  Enc Vitals Group     BP 03/27/23 1512 (!) 161/62     Pulse Rate 03/27/23 1512 (!) 41     Resp 03/27/23 1512 17     Temp 03/27/23 1512 97.9 F (36.6 C)     Temp Source 03/27/23 1512 Oral     SpO2 03/27/23 1512 98 %     Weight 03/27/23 1513 161 lb (73 kg)     Height 03/27/23 1513 5\' 3"  (1.6 m)     Head Circumference --      Peak Flow --      Pain Score 03/27/23 1513 0     Pain Loc --      Pain Edu? --      Excl. in GC? --     Most recent vital signs: Vitals:   03/30/23 0756 03/30/23 1123  BP: 137/69 133/61  Pulse: 73 64  Resp:  18  Temp:  98.5 F (36.9 C)  SpO2:  100%     General: Awake, no distress.  CV:  Good peripheral perfusion.  Normal heart tones, but noted that the patient's peripheral pulse does not necessarily match her ECG complexes, wearing she will not perfused the PVC associated with occasional runs during episodes of bigeminy Resp:  Normal effort.  Abd:  No distention.  Other:     ED Results / Procedures / Treatments   Labs (all labs ordered are listed, but only abnormal results are displayed) Labs Reviewed  BASIC METABOLIC PANEL - Abnormal; Notable for the following components:      Result Value   Sodium 133 (*)    Potassium <2.0 (*)    Chloride 89 (*)    CO2 33 (*)    Glucose, Bld 108 (*)    All other components within normal limits  CBC - Abnormal; Notable for the following components:   RBC 3.59 (*)    Hemoglobin 10.7  (*)    HCT 32.9 (*)    All other components within normal limits  URINALYSIS, ROUTINE W REFLEX MICROSCOPIC - Abnormal; Notable for the following components:   Color, Urine STRAW (*)    APPearance CLEAR (*)    Specific Gravity, Urine 1.003 (*)    All other components within normal limits  HEPATIC FUNCTION PANEL - Abnormal; Notable for the following components:   Indirect Bilirubin 1.0 (*)    All other components within normal limits  CBC - Abnormal; Notable for the following components:   RBC 3.37 (*)    Hemoglobin 10.1 (*)    HCT 31.2 (*)    All other components within normal limits  BRAIN NATRIURETIC PEPTIDE - Abnormal; Notable for the following components:   B Natriuretic Peptide 696.2 (*)    All other components within normal limits  T4, FREE - Abnormal; Notable for the following components:   Free T4 1.57 (*)    All  other components within normal limits  COMPREHENSIVE METABOLIC PANEL - Abnormal; Notable for the following components:   Potassium 2.3 (*)    CO2 33 (*)    Glucose, Bld 113 (*)    Albumin 3.2 (*)    All other components within normal limits  BASIC METABOLIC PANEL - Abnormal; Notable for the following components:   Potassium 3.0 (*)    CO2 33 (*)    Glucose, Bld 128 (*)    All other components within normal limits  T4, FREE - Abnormal; Notable for the following components:   Free T4 1.41 (*)    All other components within normal limits  IRON AND TIBC - Abnormal; Notable for the following components:   Saturation Ratios 9 (*)    All other components within normal limits  VITAMIN B12 - Abnormal; Notable for the following components:   Vitamin B-12 2,731 (*)    All other components within normal limits  BASIC METABOLIC PANEL - Abnormal; Notable for the following components:   Potassium 3.0 (*)    Chloride 97 (*)    Glucose, Bld 132 (*)    Calcium 8.7 (*)    All other components within normal limits  CBC - Abnormal; Notable for the following components:    RBC 3.27 (*)    Hemoglobin 9.4 (*)    HCT 29.9 (*)    All other components within normal limits  THYROID ANTIBODIES - Abnormal; Notable for the following components:   Thyroperoxidase Ab SerPl-aCnc 218 (*)    All other components within normal limits  BASIC METABOLIC PANEL - Abnormal; Notable for the following components:   Glucose, Bld 125 (*)    All other components within normal limits  CBC - Abnormal; Notable for the following components:   RBC 3.11 (*)    Hemoglobin 9.4 (*)    HCT 29.1 (*)    All other components within normal limits  T4, FREE - Abnormal; Notable for the following components:   Free T4 1.43 (*)    All other components within normal limits  MAGNESIUM  TSH  PHOSPHORUS  FOLATE  MAGNESIUM  PHOSPHORUS  MAGNESIUM  PHOSPHORUS  TYPE AND SCREEN     EKG  Interpreted by me at 1522 heart rate 70 QRS 100 QTc 400 Normal sinus rhythm with frequent PVCs, brief pattern of bigeminy is noted on the first 5 seconds then returning to sinus rhythm.  No evidence of acute ischemia.     PROCEDURES:  Critical Care performed: No  Procedures   MEDICATIONS ORDERED IN ED: Medications  potassium chloride 10 mEq in 100 mL IVPB (0 mEq Intravenous Stopped 03/27/23 1825)  potassium chloride 10 mEq in 100 mL IVPB (0 mEq Intravenous Stopped 03/28/23 0030)  potassium chloride SA (KLOR-CON M) CR tablet 40 mEq (40 mEq Oral Given 03/27/23 1654)  potassium chloride 10 mEq in 100 mL IVPB (0 mEq Intravenous Stopped 03/28/23 1647)  potassium chloride (KLOR-CON) packet 40 mEq (40 mEq Oral Given 03/29/23 2036)     IMPRESSION / MDM / ASSESSMENT AND PLAN / ED COURSE  I reviewed the triage vital signs and the nursing notes.                              Differential diagnosis includes, but is not limited to, electrolyte abnormality including potential hypokalemia that may be driving episodes of bigeminy fatigue generalized weakness etc.  Differential diagnosis for generalized weakness  includes  other causes as well such as cardiac, metabolic, infectious etc. but based on the presentation and history it seems that likely her episodes of intermittent bradycardia is associated with her associated hypokalemia which we will begin repleting.  Additionally, she has reported that she has some chronic element of diarrhea, and is being investigated for further workup on this by GI at this time as well.  This could be contributing to some of her hypokalemia  Patient's presentation is most consistent with acute complicated illness / injury requiring diagnostic workup.   The patient is on the cardiac monitor to evaluate for evidence of arrhythmia and/or significant heart rate changes.  Labs notable for markedly reduced potassium of less than 2.  CBC shows mild anemia which appears to be relatively chronic compared to at least 11 years ago Normal TSH.  Urinalysis without evidence of infection   Patient and family understanding agreeable with plan for admission  Consulted with and patient accepted to hospital service by Dr. Renaldo Reel  FINAL CLINICAL IMPRESSION(S) / ED DIAGNOSES   Final diagnoses:  Hypokalemia  Lightheadedness  Ventricular bigeminy     Sharyn Creamer, MD 03/31/23 [phone]

## 2023-03-27 NOTE — ED Notes (Signed)
Hospitalist messaged due to pt refused medications

## 2023-03-27 NOTE — ED Triage Notes (Signed)
Pt to er, pt states that a couple of days ago she was scheduled to have a colonoscopy and they found her heart rate was in the 30s, states that cards was going to follow up with her, states that she is here because she was feeling tired, weak and dizzy.

## 2023-03-27 NOTE — ED Notes (Signed)
Dr. Fanny Bien notified of pt critical K

## 2023-03-27 NOTE — Consult Note (Signed)
PHARMACY CONSULT NOTE - FOLLOW UP  Pharmacy Consult for Electrolyte Monitoring and Replacement   Recent Labs: Potassium (mmol/L)  Date Value  03/27/2023 <2.0 (LL)   Magnesium (mg/dL)  Date Value  40/98/1191 2.2   Calcium (mg/dL)  Date Value  47/82/9562 9.6   Albumin (g/dL)  Date Value  13/05/6577 3.7   Phosphorus (mg/dL)  Date Value  46/96/2952 3.5   Sodium (mmol/L)  Date Value  03/27/2023 133 (L)     Assessment: Patient presented with low Potassium. Mg within WNL. PMH: HTN, bradycardia HLD. Patient takes hydrochlorothiazide and irbesartan PTA.   Goal of Therapy:  WNL  Plan:  Kcl 10 mEq x 2 IV (only 1 bag given) + Kcl 40 mEq PO x 1 by medical team. Will give Kcl 10 mEq x 4 and recheck the potassium.   Ronnald Ramp ,PharmD Clinical Pharmacist 03/27/2023 6:02 PM

## 2023-03-27 NOTE — H&P (Signed)
History and Physical    Patient: Theresa [name] ZOX:[id number] DOB: 1936/06/13 DOA: 03/27/2023 DOS: the patient was seen and examined on 03/28/2023 PCP: Mick Sell, MD  Patient coming from: Home  Chief Complaint:  Chief Complaint  Patient presents with   Dizziness    HPI: Theresa [name] is a 87 y.o. female with medical history significant for hypertension, diabetes mellitus type 2, anemia presenting for generalized weakness and dizziness.  Patient went to her PCP earlier in blood results came back today was told her potassium was very low when to go to the hospital.  Patient states that for nearly about 2 to 3 weeks she has been weak off of off balance no falls no chest pain no palpitation no shortness of breath fevers chills patient states she does have chronic diarrhea and has been going to Dr. Tonna Boehringer who is going to do a colonoscopy on her and has made a referral to cardiology for her low heart rate.  Chart review shows that patient has had a history of blood in stool and GI bleed.  No reports of blood in stool currently.  Patient states that she off-and-on gets diarrhea when she eats foods upsets her stomach and within new dietary changes her bowel movements have been regular.  Granddaughter is at bedside.  Vitals show stable blood pressure heart rate varying in the 40s intermittently O2 sats of 99% on room air.  BMP shows hyponatremia of 133 hypokalemia left less than 2 bicarb of 33 glucose 108 normal LFTs. BNP of 696.2.  CBC shows chronic anemia with a hemoglobin of 10.7 normal differential otherwise, patient was last seen here in 2013 so we do not have any previous comparisons that are more recent than this.  In the emergency room patient received potassium.  Diuretic therapy with HCTZ held.  Review of Systems: Review of Systems  Constitutional:  Positive for malaise/fatigue.  Neurological:  Positive for dizziness and weakness.  All other systems reviewed and are negative.  Past  Medical History:  Diagnosis Date   Anemia    Diabetes mellitus    pt. does not take meds   DJD (degenerative joint disease) of knee    Hemorrhoids    Hyperlipidemia    refuses treatment   Hypertension    Past Surgical History:  Procedure Laterality Date   COLONOSCOPY     COLONOSCOPY WITH PROPOFOL N/A 03/25/2023   Procedure: COLONOSCOPY WITH PROPOFOL;  Surgeon: Sung Amabile, DO;  Location: [facility] ENDOSCOPY;  Service: General;  Laterality: N/A;   TONSILLECTOMY     TONSILLECTOMY     TUBAL LIGATION     Social History:  reports that she has never smoked. She has never used smokeless tobacco. She reports that she does not drink alcohol and does not use drugs.  Allergies  Allergen Reactions   Sitagliptin Nausea Only    Family History  Problem Relation Age of Onset   Prostate cancer Brother    Diabetes Brother    Anesthesia problems Mother     Prior to Admission medications   Medication Sig Start Date End Date Taking? Authorizing Provider  AMBULATORY NON FORMULARY MEDICATION Right Source True Test testing strips. Pt testing once a day. Diagnosis: Impaired fasting glucose 03/09/12   Hassan Rowan, MD  calcium carbonate (OS-CAL) 600 MG TABS Take 600 mg by mouth 2 (two) times daily.      [provider]  Cholecalciferol (VITAMIN D-3) 125 MCG (5000 UT) TABS Take 1 tablet by mouth  daily.    [provider]  Collagen-Vitamin C-Biotin (COLLAGEN 1500/C PO) Take 2,500 mg by mouth 2 (two) times daily.    [provider]  fish oil-omega-3 fatty acids 1000 MG capsule Take 1 g by mouth 2 (two) times daily.     [provider]  guaiFENesin (MUCINEX) 600 MG 12 hr tablet Take 600 mg by mouth 2 (two) times daily as needed.    [provider]  hydrochlorothiazide (HYDRODIURIL) 25 MG tablet Take 1 tablet (25 mg total) by mouth daily. 03/09/12   Hassan Rowan, MD  irbesartan (AVAPRO) 300 MG tablet Take 1 tablet (300 mg total) by mouth daily. 03/09/12 03/25/23  Hassan Rowan, MD  LUTEIN PO Take 1 tablet by mouth daily.    [provider]  magnesium oxide (MAG-OX) 400 (240 Mg) MG tablet Take 400 mg by mouth daily.    [provider]  Multiple Vitamins-Minerals (MULTIVITAL) tablet Take 1 tablet by mouth daily.      [provider]  POTASSIUM CHLORIDE ER PO Take 10 mEq by mouth daily.    [provider]  Zinc Sulfate (ZINC 15 PO) Take 1 tablet by mouth daily.    [provider]  metoprolol (TOPROL XL) 50 MG 24 hr tablet Take 1 tablet (50 mg total) by mouth daily. 02/13/11 12/25/11  Bowen, Scot Jun, DO     Vitals:   03/27/23 2108 03/27/23 2200 03/28/23 0000 03/28/23 0014  BP: 135/78 137/60  (!) 141/78  Pulse: 71     Resp: 18 15 13    Temp:      TempSrc:      SpO2: 99%     Weight:      Height:       Physical Exam Vitals and nursing note reviewed.  Constitutional:      General: She is not in acute distress. HENT:     Head: Normocephalic and atraumatic.     Right Ear: Hearing normal.     Left Ear: Hearing normal.     Nose: Nose normal. No nasal deformity.     Mouth/Throat:     Lips: Pink.     Tongue: No lesions.     Pharynx: Oropharynx is clear.  Eyes:     General: Lids are normal.     Extraocular Movements: Extraocular movements intact.  Cardiovascular:     Rate and Rhythm: Regular rhythm. Bradycardia present.     Pulses:          Dorsalis pedis pulses are 1+ on the right side and 1+ on the left side.       Posterior tibial pulses are 1+ on the right side and 1+ on the left side.     Heart sounds: Normal heart sounds.     Comments: Suspect 1+ pulses secondary to bradycardia, will monitor and recheck and if that persists we will obtain an ABI.  Patient clinically does not describe any claudication symptoms. Pulmonary:     Effort: Pulmonary effort is normal.     Breath sounds: Normal breath sounds.  Abdominal:     General: Bowel sounds are normal. There is no distension.     Palpations: Abdomen is  soft. There is no mass.     Tenderness: There is no abdominal tenderness.  Musculoskeletal:     Right lower leg: No edema.     Left lower leg: No edema.  Skin:    General: Skin is warm.  Neurological:     General:  No focal deficit present.     Mental Status: She is alert and oriented to person, place, and time.     Cranial Nerves: Cranial nerves 2-12 are intact.  Psychiatric:        Attention and Perception: Attention normal.        Mood and Affect: Mood normal.        Speech: Speech normal.        Behavior: Behavior normal. Behavior is cooperative.      Labs on Admission: I have personally reviewed following labs and imaging studies  CBC: Recent Labs  Lab 03/27/23 1539  WBC 7.0  HGB 10.7*  HCT 32.9*  MCV 91.6  PLT 309   Basic Metabolic Panel: Recent Labs  Lab 03/27/23 1539  NA 133*  K <2.0*  CL 89*  CO2 33*  GLUCOSE 108*  BUN 14  CREATININE 0.64  CALCIUM 9.6  MG 2.2  PHOS 3.5   GFR: Estimated Creatinine Clearance: 50.1 mL/min (by C-G formula based on SCr of 0.64 mg/dL). Liver Function Tests: Recent Labs  Lab 03/27/23 1539  AST 27  ALT 14  ALKPHOS 50  BILITOT 1.2  PROT 7.1  ALBUMIN 3.7   No results for input(s): "LIPASE", "AMYLASE" in the last 168 hours. No results for input(s): "AMMONIA" in the last 168 hours. Coagulation Profile: No results for input(s): "INR", "PROTIME" in the last 168 hours. Cardiac Enzymes: No results for input(s): "CKTOTAL", "CKMB", "CKMBINDEX", "TROPONINI" in the last 168 hours. BNP (last 3 results) No results for input(s): "PROBNP" in the last 8760 hours. HbA1C: No results for input(s): "HGBA1C" in the last 72 hours. CBG: No results for input(s): "GLUCAP" in the last 168 hours. Lipid Profile: No results for input(s): "CHOL", "HDL", "LDLCALC", "TRIG", "CHOLHDL", "LDLDIRECT" in the last 72 hours. Thyroid Function Tests: Recent Labs    03/27/23 1539  TSH 1.777  FREET4 1.57*   Anemia Panel: No results for input(s):  "VITAMINB12", "FOLATE", "FERRITIN", "TIBC", "IRON", "RETICCTPCT" in the last 72 hours. Urine analysis:    Component Value Date/Time   COLORURINE STRAW (A) 03/27/2023 1650   APPEARANCEUR CLEAR (A) 03/27/2023 1650   LABSPEC 1.003 (L) 03/27/2023 1650   PHURINE 7.0 03/27/2023 1650   GLUCOSEU NEGATIVE 03/27/2023 1650   HGBUR NEGATIVE 03/27/2023 1650   BILIRUBINUR NEGATIVE 03/27/2023 1650   BILIRUBINUR neg 12/25/2011 0935   KETONESUR NEGATIVE 03/27/2023 1650   PROTEINUR NEGATIVE 03/27/2023 1650   UROBILINOGEN 0.2 12/25/2011 0935   NITRITE NEGATIVE 03/27/2023 1650   LEUKOCYTESUR NEGATIVE 03/27/2023 1650    Radiological Exams on Admission: No results found.   Data Reviewed: Relevant notes from primary care and specialist visits, past discharge summaries as available in EHR, including Care Everywhere. Prior diagnostic testing as pertinent to current admission diagnoses Updated medications and problem lists for reconciliation ED course, including vitals, labs, imaging, treatment and response to treatment Triage notes, nursing and pharmacy notes and ED provider's notes Notable results as noted in HPI Assessment and Plan: * Generalized weakness Secondary to bradycardia, electrolyte abnormalities. Will correct underlying etiology and manage and follow, PT consult prior to discharge.  Sinus bradycardia Patient's heart rate has been intermittently bradycardic which I suspect is secondary to her electrolyte levels and low potassium.  We will request cardiology consult to evaluate as patient is also developing CHF like symptoms with lower extremity edema and elevated BNP.  Will check a free T4 and a TSH.  Hypokalemia Replaced and pharmacy consulted for close monitoring on tele and pharmacy  consulted.  Hold diuretic therapy.  Magnesium levels normal.  Anemia    Latest Ref Rng & Units 03/27/2023    3:39 PM 12/25/2011   10:13 AM 08/26/2011    2:37 PM  CBC  WBC 4.0 - 10.5 K/uL 7.0  7.8  8.1    Hemoglobin 12.0 - 15.0 g/dL 16.1  09.6  04.5   Hematocrit 36.0 - 46.0 % 32.9  37.4  37.9   Platelets 150 - 400 K/uL 309  312  341   Stable we will follow.    Diarrhea Patient describes diarrhea that has been chronic.  With her hemoglobin being mildly low, lower GI bleed or upper GI bleed is always a differential unfortunately in her case we will obtain stool occult.  IV PPI, type and screen.   GI consult as deemed appropriate  Essential hypertension, benign Patient continued on Avapro at 150 mg, hydralazine as needed. HCTZ is currently held.    Unresulted Labs (From admission, onward)     Start     Ordered   03/28/23 0500  CBC  Tomorrow morning,   R        03/27/23 1737   03/28/23 0200  Comprehensive metabolic panel  Once-Timed,   TIMED        03/27/23 1813            DVT prophylaxis:  Heparin  Consults:  Pharmacy  Advance Care Planning:    Code Status: Full Code   Family Communication:  Granddaughter is at bedside  Disposition Plan:  Back to previous home environment  Severity of Illness: The appropriate patient status for this patient is OBSERVATION. Observation status is judged to be reasonable and necessary in order to provide the required intensity of service to ensure the patient's safety. The patient's presenting symptoms, physical exam findings, and initial radiographic and laboratory data in the context of their medical condition is felt to place them at decreased risk for further clinical deterioration. Furthermore, it is anticipated that the patient will be medically stable for discharge from the hospital within 2 midnights of admission.   Author: Gertha Calkin, MD 03/28/2023 1:42 AM  For on call review [email or web address].

## 2023-03-28 ENCOUNTER — Encounter: Payer: Self-pay | Admitting: Internal Medicine

## 2023-03-28 ENCOUNTER — Observation Stay
Admit: 2023-03-28 | Discharge: 2023-03-28 | Disposition: A | Discharge status: Home / Self Care | Payer: Medicare Other | Attending: Student | Admitting: Student

## 2023-03-28 DIAGNOSIS — I5020 Unspecified systolic (congestive) heart failure: Secondary | ICD-10-CM | POA: Diagnosis present

## 2023-03-28 DIAGNOSIS — D638 Anemia in other chronic diseases classified elsewhere: Secondary | ICD-10-CM | POA: Diagnosis present

## 2023-03-28 DIAGNOSIS — Z833 Family history of diabetes mellitus: Secondary | ICD-10-CM | POA: Diagnosis not present

## 2023-03-28 DIAGNOSIS — Z888 Allergy status to other drugs, medicaments and biological substances status: Secondary | ICD-10-CM | POA: Diagnosis not present

## 2023-03-28 DIAGNOSIS — E119 Type 2 diabetes mellitus without complications: Secondary | ICD-10-CM | POA: Diagnosis present

## 2023-03-28 DIAGNOSIS — E785 Hyperlipidemia, unspecified: Secondary | ICD-10-CM | POA: Diagnosis present

## 2023-03-28 DIAGNOSIS — D649 Anemia, unspecified: Secondary | ICD-10-CM | POA: Diagnosis present

## 2023-03-28 DIAGNOSIS — R001 Bradycardia, unspecified: Secondary | ICD-10-CM | POA: Diagnosis present

## 2023-03-28 DIAGNOSIS — E0590 Thyrotoxicosis, unspecified without thyrotoxic crisis or storm: Secondary | ICD-10-CM | POA: Diagnosis present

## 2023-03-28 DIAGNOSIS — R531 Weakness: Secondary | ICD-10-CM | POA: Diagnosis not present

## 2023-03-28 DIAGNOSIS — R008 Other abnormalities of heart beat: Secondary | ICD-10-CM | POA: Diagnosis present

## 2023-03-28 DIAGNOSIS — K644 Residual hemorrhoidal skin tags: Secondary | ICD-10-CM | POA: Diagnosis present

## 2023-03-28 DIAGNOSIS — L909 Atrophic disorder of skin, unspecified: Secondary | ICD-10-CM | POA: Diagnosis not present

## 2023-03-28 DIAGNOSIS — Z79899 Other long term (current) drug therapy: Secondary | ICD-10-CM | POA: Diagnosis not present

## 2023-03-28 DIAGNOSIS — I110 Hypertensive heart disease with heart failure: Secondary | ICD-10-CM | POA: Diagnosis present

## 2023-03-28 DIAGNOSIS — Z8262 Family history of osteoporosis: Secondary | ICD-10-CM | POA: Diagnosis not present

## 2023-03-28 DIAGNOSIS — E669 Obesity, unspecified: Secondary | ICD-10-CM | POA: Diagnosis present

## 2023-03-28 DIAGNOSIS — Z8249 Family history of ischemic heart disease and other diseases of the circulatory system: Secondary | ICD-10-CM | POA: Diagnosis not present

## 2023-03-28 DIAGNOSIS — E871 Hypo-osmolality and hyponatremia: Secondary | ICD-10-CM | POA: Diagnosis present

## 2023-03-28 DIAGNOSIS — R197 Diarrhea, unspecified: Secondary | ICD-10-CM | POA: Diagnosis present

## 2023-03-28 DIAGNOSIS — Z8261 Family history of arthritis: Secondary | ICD-10-CM | POA: Diagnosis not present

## 2023-03-28 DIAGNOSIS — E876 Hypokalemia: Secondary | ICD-10-CM | POA: Diagnosis present

## 2023-03-28 LAB — CBC
HCT: 31.2 % — ABNORMAL LOW (ref 36.0–46.0)
Hemoglobin: 10.1 g/dL — ABNORMAL LOW (ref 12.0–15.0)
MCH: 30 pg (ref 26.0–34.0)
MCHC: 32.4 g/dL (ref 30.0–36.0)
MCV: 92.6 fL (ref 80.0–100.0)
Platelets: 281 10*3/uL (ref 150–400)
RBC: 3.37 MIL/uL — ABNORMAL LOW (ref 3.87–5.11)
RDW: 12.9 % (ref 11.5–15.5)
WBC: 7 10*3/uL (ref 4.0–10.5)
nRBC: 0 % (ref 0.0–0.2)

## 2023-03-28 LAB — BASIC METABOLIC PANEL
Anion gap: 11 (ref 5–15)
BUN: 10 mg/dL (ref 8–23)
CO2: 33 mmol/L — ABNORMAL HIGH (ref 22–32)
Calcium: 9.5 mg/dL (ref 8.9–10.3)
Chloride: 98 mmol/L (ref 98–111)
Creatinine, Ser: 0.58 mg/dL (ref 0.44–1.00)
GFR, Estimated: >60 mL/min (ref >60)
Glucose, Bld: 128 mg/dL — ABNORMAL HIGH (ref 70–99)
Potassium: 3 mmol/L — ABNORMAL LOW (ref 3.5–5.1)
Sodium: 142 mmol/L (ref 135–145)

## 2023-03-28 LAB — ECHOCARDIOGRAM COMPLETE
AR max vel: 2.03 cm2
AV Peak grad: 8.3 mmHg
Ao pk vel: 1.44 m/s
Area-P 1/2: 2.57 cm2
Calc EF: 44.7 %
Height: 63 in
S' Lateral: 3.8 cm
Single Plane A2C EF: 38.5 %
Single Plane A4C EF: 50.6 %
Weight: 2576 oz

## 2023-03-28 LAB — COMPREHENSIVE METABOLIC PANEL
ALT: 12 U/L (ref 0–44)
AST: 27 U/L (ref 15–41)
Albumin: 3.2 g/dL — ABNORMAL LOW (ref 3.5–5.0)
Alkaline Phosphatase: 47 U/L (ref 38–126)
Anion gap: 10 (ref 5–15)
BUN: 10 mg/dL (ref 8–23)
CO2: 33 mmol/L — ABNORMAL HIGH (ref 22–32)
Calcium: 9.2 mg/dL (ref 8.9–10.3)
Chloride: 99 mmol/L (ref 98–111)
Creatinine, Ser: 0.57 mg/dL (ref 0.44–1.00)
GFR, Estimated: >60 mL/min (ref >60)
Glucose, Bld: 113 mg/dL — ABNORMAL HIGH (ref 70–99)
Potassium: 2.3 mmol/L — CL (ref 3.5–5.1)
Sodium: 142 mmol/L (ref 135–145)
Total Bilirubin: 1.2 mg/dL (ref 0.3–1.2)
Total Protein: 6.5 g/dL (ref 6.5–8.1)

## 2023-03-28 LAB — IRON AND TIBC
Iron: 35 ug/dL (ref 28–170)
Saturation Ratios: 9 % — ABNORMAL LOW (ref 10.4–31.8)
TIBC: 374 ug/dL (ref 250–450)
UIBC: 339 ug/dL

## 2023-03-28 LAB — T4, FREE: Free T4: 1.41 ng/dL — ABNORMAL HIGH (ref 0.61–1.12)

## 2023-03-28 LAB — FOLATE: Folate: 28 ng/mL (ref >5.9)

## 2023-03-28 LAB — VITAMIN B12: Vitamin B-12: 2731 pg/mL — ABNORMAL HIGH (ref 180–914)

## 2023-03-28 MED ORDER — POTASSIUM CHLORIDE 10 MEQ/100ML IV SOLN
10.0000 meq | INTRAVENOUS | Status: AC
  Administered 2023-03-28 (×6): 10 meq via INTRAVENOUS
  Filled 2023-03-28 (×6): qty 100

## 2023-03-28 MED ORDER — POTASSIUM CHLORIDE 20 MEQ PO PACK
60.0000 meq | PACK | Freq: Two times a day (BID) | ORAL | Status: DC
  Administered 2023-03-28: 60 meq via ORAL
  Filled 2023-03-28 (×2): qty 3

## 2023-03-28 MED ORDER — ENSURE ENLIVE PO LIQD
237.0000 mL | Freq: Two times a day (BID) | ORAL | Status: DC
  Administered 2023-03-29: 237 mL via ORAL

## 2023-03-28 NOTE — Assessment & Plan Note (Signed)
Patient describes diarrhea that has been chronic.  With her hemoglobin being mildly low, lower GI bleed or upper GI bleed is always a differential unfortunately in her case we will obtain stool occult.  IV PPI, type and screen.   GI consult as deemed appropriate

## 2023-03-28 NOTE — Assessment & Plan Note (Addendum)
Replaced and pharmacy consulted for close monitoring on tele and pharmacy consulted.  Hold diuretic therapy.  Magnesium levels normal.

## 2023-03-28 NOTE — Consult Note (Signed)
CARDIOLOGY CONSULT NOTE               Patient ID: Theresa [name] MRN: [id number] DOB/AGE: [birth year] 87 y.o.  Admit date: 03/27/2023 Referring Physician Dr. Irena Cords hospitalist Primary Physician Dr. Clydie Braun primary Primary Cardiologist  Reason for Consultation bradycardia  HPI: 87 year old female history of hypertension diabetes anemia presented with generalized weakness dizziness and fatigue patient's had persistent diarrhea for quite some time but she is complaining of being weak and off balance no chest pain no shortness of breath no fever chills or sweats diarrhea has been ongoing for quite some time followed by Dr. Tonna Boehringer including for colonoscopy which was recently canceled because of her heart rate was slow and she was being referred to cardiology for further evaluation.  Patient was reportedly supposed to follow-up with cardiology with heart rates in the low 40s patient's had electrolyte normalities in the past but now presenting with potassium less than 2 elevated BNP with mild bradycardia to cardiology was then recommended for further evaluation denies any recent falls but does complain of malaise fatigue dizziness and weakness.  Denies any previous cardiac history  Review of systems complete and found to be negative unless listed above     Past Medical History:  Diagnosis Date   Anemia    Diabetes mellitus    pt. does not take meds   DJD (degenerative joint disease) of knee    Hemorrhoids    Hyperlipidemia    refuses treatment   Hypertension     Past Surgical History:  Procedure Laterality Date   COLONOSCOPY     COLONOSCOPY WITH PROPOFOL N/A 03/25/2023   Procedure: COLONOSCOPY WITH PROPOFOL;  Surgeon: Sung Amabile, DO;  Location: [facility] ENDOSCOPY;  Service: General;  Laterality: N/A;   TONSILLECTOMY     TONSILLECTOMY     TUBAL LIGATION      Medications Prior to Admission  Medication Sig Dispense Refill Last Dose   Alfalfa 500 MG TABS Take 1 tablet by  mouth as directed.      Cholecalciferol (VITAMIN D-3) 125 MCG (5000 UT) TABS Take 5,000 Units by mouth daily.      cyanocobalamin (VITAMIN B12) 1000 MCG tablet Take 1,000 mcg by mouth daily.      docusate sodium (COLACE) 100 MG capsule Take 100 mg by mouth at bedtime.   03/26/2023 at 2000   hydrochlorothiazide (HYDRODIURIL) 25 MG tablet Take 1 tablet (25 mg total) by mouth daily. 90 tablet 3 03/27/2023 at 0800   irbesartan (AVAPRO) 300 MG tablet Take 1 tablet (300 mg total) by mouth daily. (Patient taking differently: Take 150 mg by mouth daily.) 90 tablet 3 03/27/2023 at 0800   magnesium oxide (MAG-OX) 400 (240 Mg) MG tablet Take 400 mg by mouth at bedtime.      Multiple Vitamins-Minerals (MULTIVITAL) tablet Take 1 tablet by mouth daily.        OVER THE COUNTER MEDICATION Take 2-3 tablets by mouth as directed. Osteo[employer] by [employer]      potassium chloride (KLOR-CON) 10 MEQ tablet Take 20 mEq by mouth at bedtime.   03/26/2023 at 2000   Social History   Socioeconomic History   Marital status: Divorced    Spouse name: Not on file   Number of children: Not on file   Years of education: Not on file   Highest education level: Not on file  Occupational History   Not on file  Tobacco Use   Smoking status: Never   Smokeless tobacco:  Never  Vaping Use   Vaping Use: Never used  Substance and Sexual Activity   Alcohol use: No   Drug use: No   Sexual activity: Not on file  Other Topics Concern   Not on file  Social History Narrative   Not on file   Social Determinants of Health   Financial Resource Strain: Not on file  Food Insecurity: No Food Insecurity (03/28/2023)   Hunger Vital Sign    Worried About Running Out of Food in the Last Year: Never true    Ran Out of Food in the Last Year: Never true  Transportation Needs: No Transportation Needs (03/28/2023)   PRAPARE - Administrator, Civil Service (Medical): No    Lack of Transportation (Non-Medical): No  Physical Activity:  Not on file  Stress: Not on file  Social Connections: Not on file  Intimate Partner Violence: Not At Risk (03/28/2023)   Humiliation, Afraid, Rape, and Kick questionnaire    Fear of Current or Ex-Partner: No    Emotionally Abused: No    Physically Abused: No    Sexually Abused: No    Family History  Problem Relation Age of Onset   Prostate cancer Brother    Diabetes Brother    Anesthesia problems Mother       Review of systems complete and found to be negative unless listed above      PHYSICAL EXAM  General: Well developed, well nourished, in no acute distress HEENT:  Normocephalic and atramatic Neck:  No JVD.  Lungs: Clear bilaterally to auscultation and percussion. Heart: HRRR . Normal S1 and S2 without gallops or murmurs.  Abdomen: Bowel sounds are positive, abdomen soft and non-tender  Msk:  Back normal, normal gait. Normal strength and tone for age. Extremities: No clubbing, cyanosis or edema.   Neuro: Alert and oriented X 3. Psych:  Good affect, responds appropriately  Labs:   Lab Results  Component Value Date   WBC 7.0 03/28/2023   HGB 10.1 (L) 03/28/2023   HCT 31.2 (L) 03/28/2023   MCV 92.6 03/28/2023   PLT 281 03/28/2023    Recent Labs  Lab 03/28/23 0504 03/28/23 1431  NA 142 142  K 2.3* 3.0*  CL 99 98  CO2 33* 33*  BUN 10 10  CREATININE 0.57 0.58  CALCIUM 9.2 9.5  PROT 6.5  --   BILITOT 1.2  --   ALKPHOS 47  --   ALT 12  --   AST 27  --   GLUCOSE 113* 128*   No results found for: "CKTOTAL", "CKMB", "CKMBINDEX", "TROPONINI"  Lab Results  Component Value Date   CHOL 219 (H) 12/25/2011   CHOL 294 (H) 08/26/2011   CHOL 226 (H) 05/22/2010   Lab Results  Component Value Date   HDL 69 12/25/2011   HDL 72 08/26/2011   HDL 71 05/22/2010   Lab Results  Component Value Date   LDLCALC 137 (H) 12/25/2011   LDLCALC 201 (H) 08/26/2011   LDLCALC 139 (H) 05/22/2010   Lab Results  Component Value Date   TRIG 67 12/25/2011   TRIG 103  08/26/2011   TRIG 78 05/22/2010   Lab Results  Component Value Date   CHOLHDL 3.2 12/25/2011   CHOLHDL 4.1 08/26/2011   CHOLHDL 3.2 Ratio 05/22/2010   No results found for: "LDLDIRECT"    Radiology: ECHOCARDIOGRAM COMPLETE  Result Date: 03/28/2023    ECHOCARDIOGRAM REPORT   Patient Name:   KYMBERLYN ECKFORD Date  of Exam: 03/28/2023 Medical Rec #:  [id number]      Height:       63.0 in Accession #:    [id number]     Weight:       161.0 lb Date of Birth:  20-Feb-1936      BSA:          1.763 m Patient Age:    86 years       BP:           140/77 mmHg Patient Gender: F              HR:           74 bpm. Exam Location:  [facility] Procedure: 2D Echo                                 MODIFIED REPORT:   This report was modified by Alwyn Pea MD on 03/28/2023 due to global                                    hypokesis.  Indications:     CHF I50.21  History:         Patient has no prior history of Echocardiogram examinations.  Sonographer:     Overton Mam RDCS, FASE Referring Phys:  [id number] Gillis Santa Diagnosing Phys: Alwyn Pea MD IMPRESSIONS  1. Left ventricular ejection fraction, by estimation, is 40 to 45%. The left ventricle has mildly decreased function. The left ventricle demonstrates global hypokinesis. Left ventricular diastolic parameters are consistent with Grade I diastolic dysfunction (impaired relaxation).  2. Right ventricular systolic function is low normal. The right ventricular size is mildly enlarged.  3. The mitral valve is grossly normal. Trivial mitral valve regurgitation.  4. The aortic valve is grossly normal. Aortic valve regurgitation is trivial. FINDINGS  Left Ventricle: Left ventricular ejection fraction, by estimation, is 40 to 45%. The left ventricle has mildly decreased function. The left ventricle demonstrates global hypokinesis. The left ventricular internal cavity size was normal in size. There is  no left ventricular hypertrophy. Left ventricular diastolic parameters are  consistent with Grade I diastolic dysfunction (impaired relaxation). Right Ventricle: The right ventricular size is mildly enlarged. No increase in right ventricular wall thickness. Right ventricular systolic function is low normal. Left Atrium: Left atrial size was normal in size. Right Atrium: Right atrial size was normal in size. Pericardium: There is no evidence of pericardial effusion. Mitral Valve: The mitral valve is grossly normal. Trivial mitral valve regurgitation. Tricuspid Valve: The tricuspid valve is grossly normal. Tricuspid valve regurgitation is mild. Aortic Valve: The aortic valve is grossly normal. Aortic valve regurgitation is trivial. Aortic valve peak gradient measures 8.3 mmHg. Pulmonic Valve: The pulmonic valve was normal in structure. Pulmonic valve regurgitation is not visualized. Aorta: The ascending aorta was not well visualized. IAS/Shunts: No atrial level shunt detected by color flow Doppler.  LEFT VENTRICLE PLAX 2D LVIDd:         4.80 cm      Diastology LVIDs:         3.80 cm      LV e' medial:    5.77 cm/s LV PW:         1.10 cm      LV E/e' medial:  10.6 LV IVS:  0.90 cm      LV e' lateral:   7.12 cm/s LVOT diam:     1.90 cm      LV E/e' lateral: 8.6 LV SV:         55 LV SV Index:   31 LVOT Area:     2.84 cm  LV Volumes (MOD) LV vol d, MOD A2C: 111.0 ml LV vol d, MOD A4C: 108.0 ml LV vol s, MOD A2C: 68.3 ml LV vol s, MOD A4C: 53.4 ml LV SV MOD A2C:     42.7 ml LV SV MOD A4C:     108.0 ml LV SV MOD BP:      49.8 ml RIGHT VENTRICLE RV Basal diam:  3.30 cm RV S prime:     14.50 cm/s TAPSE (M-mode): 2.2 cm LEFT ATRIUM             Index        RIGHT ATRIUM           Index LA diam:        3.70 cm 2.10 cm/m   RA Area:     11.80 cm LA Vol (A2C):   50.6 ml 28.70 ml/m  RA Volume:   28.70 ml  16.28 ml/m LA Vol (A4C):   50.5 ml 28.64 ml/m LA Biplane Vol: 51.9 ml 29.43 ml/m  AORTIC VALVE                 PULMONIC VALVE AV Area (Vmax): 2.03 cm     PV Vmax:       1.27 m/s AV Vmax:         144.00 cm/s  PV Peak grad:  6.5 mmHg AV Peak Grad:   8.3 mmHg LVOT Vmax:      103.00 cm/s LVOT Vmean:     62.600 cm/s LVOT VTI:       0.194 m  AORTA Ao Root diam: 2.90 cm Ao Asc diam:  3.00 cm MITRAL VALVE                TRICUSPID VALVE MV Area (PHT): 2.57 cm     TR Peak grad:   31.6 mmHg MV Decel Time: 295 msec     TR Vmax:        281.00 cm/s MV E velocity: 61.30 cm/s MV A velocity: 112.00 cm/s  SHUNTS MV E/A ratio:  0.55         Systemic VTI:  0.19 m                             Systemic Diam: 1.90 cm Mercy Malena Salome Arnt MD Electronically signed by Alwyn Pea MD Signature Date/Time: 03/28/2023/6:28:45 PM    Final (Updated)    MM 3D SCREENING MAMMOGRAM BILATERAL BREAST  Result Date: 03/04/2023 CLINICAL DATA:  Screening. EXAM: DIGITAL SCREENING BILATERAL MAMMOGRAM WITH TOMOSYNTHESIS AND CAD TECHNIQUE: Bilateral screening digital craniocaudal and mediolateral oblique mammograms were obtained. Bilateral screening digital breast tomosynthesis was performed. The images were evaluated with computer-aided detection. COMPARISON:  Previous exam(s). ACR Breast Density Category a: The breasts are almost entirely fatty. FINDINGS: There are no findings suspicious for malignancy. IMPRESSION: No mammographic evidence of malignancy. A result letter of this screening mammogram will be mailed directly to the patient. RECOMMENDATION: Screening mammogram in one year. (Code:SM-B-01Y) BI-RADS CATEGORY  1: Negative. Electronically Signed   By: Edwin Cap M.D.   On: 03/04/2023 15:15    EKG:  Normal sinus rhythm with ventricular bigeminy heart rate of 80 nonspecific ST-T wave changes  ASSESSMENT AND PLAN:  Generalized weakness Hypokalemia Persistent diarrhea Bradycardia Lower extremity edema Hypertension Hyperlipidemia Obesity . Plan Reviewed admit for rehydration management Correct electrolytes including potassium to above 4 Continue mild hydration for recurrent diarrhea Continue hypertension management  was currently on HCTZ Avapro metoprolol with supplemental potassium but would recommend discontinuing HCTZ and increase potassium supplementation IV and p.o. Continue diabetes management and control Bradycardia appears to be improved no clear indication for permanent pacemaker Agree with echocardiogram for assessment left ventricular function wall motion History of hyperlipidemia reportedly patient has refused statin therapy in the past Recommend conservative cardiac management at this stage  Signed: Alwyn Pea MD 03/28/2023, 6:56 PM

## 2023-03-28 NOTE — Progress Notes (Signed)
[facility] Progress Note  Patient: Theresa [name]    ZOX:[id number]  DOA: 03/27/2023     Date of Service: the patient was seen and examined on 03/28/2023  Chief Complaint  Patient presents with   Dizziness   Brief hospital course: Theresa [name] is a 87 y.o. female with medical history significant for hypertension, diabetes mellitus type 2, anemia presenting for generalized weakness and dizziness.   Patient states that for nearly about 2 to 3 weeks she has been weak off of off balance. Patient states she does have chronic diarrhea and has been going to Dr. Tonna Boehringer who is going to do a colonoscopy on her and has made a referral to cardiology for her low heart rate. Patient went to her PCP earlier in blood results came back today was told her potassium was very low when to go to the hospital.  Patient does have a history of GI bleeding in the past, no active bleeding at this time.  Patient has chronic diarrhea off and on due to stomach upset, she goes to the bathroom whenever she eats.  Patient was given IBD diet.  ED workup: Hypertensive BP 161/62, bradycardic heart rate 41 Hypokalemia potassium <2.0, mag and Phos within normal range BNP 696 slightly elevated  Assessment and Plan:  Hypokalemia most likely secondary to chronic diarrhea, HCTZ and GI prep done for colonoscopy Potassium 2.0 on admission Potassium was repleted IV and orally Monitor potassium level and replete as needed   Hypertension with bradycardia Resumed irbesartan 150 mg p.o. daily home dose Held hydrochlorothiazide due to hypokalemia continue to monitor on telemetry Monitor BP Use hydralazine as needed Follow 2D echocardiogram   Abnormal thyroid function test TSH 1.77 within normal range, free T41.57 Slightly elevated Follow repeat free T4 level  Chronic diarrhea No bowel movements since yesterday Continue to monitor   Anemia of chronic disease Follow anemia workup Monitor H&H   Body mass index is  28.52 kg/m.  Interventions:  Diet: Heart healthy diet DVT Prophylaxis: Subcutaneous Heparin    Advance goals of care discussion: Full code  Family Communication: family was not present at bedside, at the time of interview.  The pt provided permission to discuss medical plan with the family. Opportunity was given to ask question and all questions were answered satisfactorily.   Disposition:  Pt is from Home, admitted with hyperkalemia, bradycardia, still has low potassium, which precludes a safe discharge. Discharge to home, when clinically stable, may need few days to improve.  Subjective: No significant events overnight, patient is feeling improvement in her generalized weakness, denies any specific complaints, no chest pain or palpitation, no abdominal pain, no diarrhea since yesterday.   Physical Exam: General: NAD, lying comfortably Appear in no distress, affect appropriate Eyes: PERRLA ENT: Oral Mucosa Clear, moist  Neck: no JVD,  Cardiovascular: S1 and S2 Present, no Murmur,  Respiratory: good respiratory effort, Bilateral Air entry equal and Decreased, no Crackles, no wheezes Abdomen: Bowel Sound present, Soft and no tenderness,  Skin: no rashes Extremities: no Pedal edema, no calf tenderness Neurologic: without any new focal findings Gait not checked due to patient safety concerns  Vitals:   03/28/23 0723 03/28/23 1052 03/28/23 1301 03/28/23 1340  BP: (!) 144/71 (!) 144/70 122/81 (!) 140/77  Pulse: 65 70 76 66  Resp: 18 20 (!) 26 17  Temp: 98.2 F (36.8 C) 98 F (36.7 C) 97.7 F (36.5 C) 98 F (36.7 C)  TempSrc: Oral Oral Oral   SpO2:  100% 100% 100% 100%  Weight:      Height:        Intake/Output Summary (Last 24 hours) at 03/28/2023 1446 Last data filed at 03/27/2023 1825 Gross per 24 hour  Intake 100 ml  Output --  Net 100 ml   Filed Weights   03/27/23 1513  Weight: 73 kg    Data Reviewed: I have personally reviewed and interpreted daily labs,  tele strips, imagings as discussed above. I reviewed all nursing notes, pharmacy notes, vitals, pertinent old records I have discussed plan of care as described above with RN and patient/family.  CBC: Recent Labs  Lab 03/27/23 1539 03/28/23 0504  WBC 7.0 7.0  HGB 10.7* 10.1*  HCT 32.9* 31.2*  MCV 91.6 92.6  PLT 309 281   Basic Metabolic Panel: Recent Labs  Lab 03/27/23 1539 03/28/23 0504  NA 133* 142  K <2.0* 2.3*  CL 89* 99  CO2 33* 33*  GLUCOSE 108* 113*  BUN 14 10  CREATININE 0.64 0.57  CALCIUM 9.6 9.2  MG 2.2  --   PHOS 3.5  --     Studies: No results found.  Scheduled Meds:  feeding supplement  237 mL Oral BID BM   heparin  5,000 Units Subcutaneous Q8H   irbesartan  150 mg Oral Daily   sodium chloride flush  3 mL Intravenous Q12H   Continuous Infusions:  potassium chloride 10 mEq (03/28/23 1357)   PRN Meds: acetaminophen **OR** acetaminophen, hydrALAZINE  Time spent: 50 minutes  Author: Gillis Santa. MD [facility] 03/28/2023 2:46 PM  To reach On-call, see care teams to locate the attending and reach out to them via [email or web address]. If 7PM-7AM, please contact night-coverage If you still have difficulty reaching the attending provider, please page the [facility] (Director on Call) for [facility] on amion for assistance.

## 2023-03-28 NOTE — Consult Note (Signed)
PHARMACY CONSULT NOTE - FOLLOW UP  Pharmacy Consult for Electrolyte Monitoring and Replacement   Recent Labs: Potassium (mmol/L)  Date Value  03/28/2023 2.3 (LL)   Magnesium (mg/dL)  Date Value  40/98/1191 2.2   Calcium (mg/dL)  Date Value  47/82/9562 9.2   Albumin (g/dL)  Date Value  13/05/6577 3.2 (L)   Phosphorus (mg/dL)  Date Value  46/96/2952 3.5   Sodium (mmol/L)  Date Value  03/28/2023 142     Assessment: Patient presented with low Potassium. Mg within WNL. PMH: HTN, bradycardia HLD. Patient takes hydrochlorothiazide and irbesartan PTA.   Goal of Therapy:  WNL  Plan:  6/22:  K @ 0504 = 2.3 - Will order KCl 10 mEq IV X 6 and recheck electrolytes on 6/22 @ 1400.  Scherrie Gerlach ,PharmD Clinical Pharmacist 03/28/2023 6:32 AM

## 2023-03-28 NOTE — ED Notes (Signed)
Advised nurse that patient has ready bed 

## 2023-03-28 NOTE — Progress Notes (Signed)
  Echocardiogram 2D Echocardiogram has been performed.  Theresa [name] 03/28/2023, 4:50 PM

## 2023-03-28 NOTE — Consult Note (Signed)
PHARMACY CONSULT NOTE - ELECTROLYTES  Pharmacy Consult for Electrolyte Monitoring and Replacement   Recent Labs: Potassium (mmol/L)  Date Value  03/28/2023 3.0 (L)   Magnesium (mg/dL)  Date Value  16/07/9603 2.2   Calcium (mg/dL)  Date Value  54/06/8118 9.5   Albumin (g/dL)  Date Value  14/78/2956 3.2 (L)   Phosphorus (mg/dL)  Date Value  21/30/8657 3.5   Sodium (mmol/L)  Date Value  03/28/2023 142    Assessment  Theresa [name] is a 87 y.o. female presenting with weakness. PMH significant for HTN, HLD, DJD. Pharmacy has been consulted to monitor and replace electrolytes.  Diet: regular Pertinent medications: irbesartan  Goal of Therapy: Electrolytes WNL  Plan:  Give KCl 60 mEq PO x1 Check BMP, Mg, Phos with AM labs  Thank you for allowing pharmacy to be a part of this patient's care.  Celene Squibb, PharmD Clinical Pharmacist 03/28/2023 3:18 PM

## 2023-03-28 NOTE — Assessment & Plan Note (Signed)
Patient's heart rate has been intermittently bradycardic which I suspect is secondary to her electrolyte levels and low potassium.  We will request cardiology consult to evaluate as patient is also developing CHF like symptoms with lower extremity edema and elevated BNP.  Will check a free T4 and a TSH.

## 2023-03-28 NOTE — Assessment & Plan Note (Signed)
Patient continued on Avapro at 150 mg, hydralazine as needed. HCTZ is currently held.

## 2023-03-28 NOTE — Assessment & Plan Note (Signed)
    Latest Ref Rng & Units 03/27/2023    3:39 PM 12/25/2011   10:13 AM 08/26/2011    2:37 PM  CBC  WBC 4.0 - 10.5 K/uL 7.0  7.8  8.1   Hemoglobin 12.0 - 15.0 g/dL 16.1  09.6  04.5   Hematocrit 36.0 - 46.0 % 32.9  37.4  37.9   Platelets 150 - 400 K/uL 309  312  341   Stable we will follow.

## 2023-03-28 NOTE — Assessment & Plan Note (Signed)
Secondary to bradycardia, electrolyte abnormalities. Will correct underlying etiology and manage and follow, PT consult prior to discharge.

## 2023-03-28 NOTE — Progress Notes (Signed)
Brief cardiology consult   Imp Hypokalemia Diarrhea  Bradycardia  Edema Hypertension  Hyperlipidemia Obesity Weakness  . Plan  Correct electrolytes  Mild hydration  Echo EKGs  No indication for PPM Conservative cardiac management

## 2023-03-29 ENCOUNTER — Inpatient Hospital Stay: Payer: Medicare Other

## 2023-03-29 DIAGNOSIS — R531 Weakness: Secondary | ICD-10-CM | POA: Diagnosis not present

## 2023-03-29 LAB — CBC
HCT: 29.9 % — ABNORMAL LOW (ref 36.0–46.0)
Hemoglobin: 9.4 g/dL — ABNORMAL LOW (ref 12.0–15.0)
MCH: 28.7 pg (ref 26.0–34.0)
MCHC: 31.4 g/dL (ref 30.0–36.0)
MCV: 91.4 fL (ref 80.0–100.0)
Platelets: 284 10*3/uL (ref 150–400)
RBC: 3.27 MIL/uL — ABNORMAL LOW (ref 3.87–5.11)
RDW: 13.1 % (ref 11.5–15.5)
WBC: 7.2 10*3/uL (ref 4.0–10.5)
nRBC: 0 % (ref 0.0–0.2)

## 2023-03-29 LAB — BASIC METABOLIC PANEL
Anion gap: 8 (ref 5–15)
BUN: 12 mg/dL (ref 8–23)
CO2: 29 mmol/L (ref 22–32)
Calcium: 8.7 mg/dL — ABNORMAL LOW (ref 8.9–10.3)
Chloride: 97 mmol/L — ABNORMAL LOW (ref 98–111)
Creatinine, Ser: 0.59 mg/dL (ref 0.44–1.00)
GFR, Estimated: >60 mL/min (ref >60)
Glucose, Bld: 132 mg/dL — ABNORMAL HIGH (ref 70–99)
Potassium: 3 mmol/L — ABNORMAL LOW (ref 3.5–5.1)
Sodium: 142 mmol/L (ref 135–145)

## 2023-03-29 LAB — MAGNESIUM: Magnesium: 2.1 mg/dL (ref 1.7–2.4)

## 2023-03-29 LAB — PHOSPHORUS: Phosphorus: 2.9 mg/dL (ref 2.5–4.6)

## 2023-03-29 MED ORDER — POLYSACCHARIDE IRON COMPLEX 150 MG PO CAPS
150.0000 mg | ORAL_CAPSULE | Freq: Every day | ORAL | Status: DC
  Administered 2023-03-29 – 2023-03-30 (×2): 150 mg via ORAL
  Filled 2023-03-29 (×2): qty 1

## 2023-03-29 MED ORDER — POTASSIUM CHLORIDE 20 MEQ PO PACK
40.0000 meq | PACK | Freq: Three times a day (TID) | ORAL | Status: AC
  Administered 2023-03-29 (×3): 40 meq via ORAL
  Filled 2023-03-29 (×2): qty 2

## 2023-03-29 MED ORDER — VITAMIN C 500 MG PO TABS
500.0000 mg | ORAL_TABLET | Freq: Every day | ORAL | Status: DC
  Administered 2023-03-29 – 2023-03-30 (×2): 500 mg via ORAL
  Filled 2023-03-29 (×2): qty 1

## 2023-03-29 MED ORDER — METHIMAZOLE 5 MG PO TABS
5.0000 mg | ORAL_TABLET | Freq: Every day | ORAL | Status: DC
  Administered 2023-03-29 – 2023-03-30 (×2): 5 mg via ORAL
  Filled 2023-03-29 (×2): qty 1

## 2023-03-29 NOTE — Progress Notes (Signed)
[facility] Progress Note  Patient: Theresa [name]    GYI:[id number]  DOA: 03/27/2023     Date of Service: the patient was seen and examined on 03/29/2023  Chief Complaint  Patient presents with   Dizziness   Brief hospital course: Theresa [name] is a 87 y.o. female with medical history significant for hypertension, diabetes mellitus type 2, anemia presenting for generalized weakness and dizziness.   Patient states that for nearly about 2 to 3 weeks she has been weak off of off balance. Patient states she does have chronic diarrhea and has been going to Dr. Tonna Boehringer who is going to do a colonoscopy on her and has made a referral to cardiology for her low heart rate. Patient went to her PCP earlier in blood results came back today was told her potassium was very low when to go to the hospital.  Patient does have a history of GI bleeding in the past, no active bleeding at this time.  Patient has chronic diarrhea off and on due to stomach upset, she goes to the bathroom whenever she eats.  Patient was given IBD diet.  ED workup: Hypertensive BP 161/62, bradycardic heart rate 41 Hypokalemia potassium <2.0, mag and Phos within normal range BNP 696 slightly elevated  Assessment and Plan:  # Hypokalemia most likely secondary to chronic diarrhea, HCTZ and GI prep done for colonoscopy Potassium 2.0 on admission Potassium was repleted IV and orally K 3.0 today Monitor potassium level and replete as needed   # Hypertension with bradycardia Heart rate improving, bradycardia resolved.  BP is well-controlled now Resumed irbesartan 150 mg p.o. daily home dose Held hydrochlorothiazide due to hypokalemia continue to monitor on telemetry Monitor BP Use hydralazine as needed   # Systolic CHF, unknown acuity, no prior 2D echocardiogram  Well completed, no exacerbation noticed TTE shows LVEF 40 to 45%, global hypokinesis, grade 1 diastolic dysfunction. Continue irbesartan home dose Follow-up with  cardiology and repeat 2D echocardiogram after 4 to 6 months   Hyperthyroidism TSH 1.77 within normal range, free T4 level 1.57 --1.41 Slightly elevated 6/23 started methimazole 5 mg p.o. daily US thyroid: 1. Findings suggestive of multinodular goiter. 2. None of the discretely measured thyroid nodules/pseudo nodules meet imaging criteria to recommend percutaneous sampling or continued dedicated follow-up. Follow thyroid antibodies Patient was advised to follow-up with PCP and endocrinologist as an outpatient for further management Follow repeat free T4 level  Chronic diarrhea No bowel movements since yesterday Continue to monitor   Anemia of chronic disease Folate within normal range, B12 levels are elevated Patient was advised to stop taking vitamin B12 level for now Transferrin saturation 9%, slightly low, started oral iron supplement with vitamin C. Monitor H&H   Body mass index is 28.12 kg/m.  Interventions:  Diet: Heart healthy diet DVT Prophylaxis: Subcutaneous Heparin    Advance goals of care discussion: Full code  Family Communication: family was not present at bedside, at the time of interview.  The pt provided permission to discuss medical plan with the family. Opportunity was given to ask question and all questions were answered satisfactorily.   Disposition:  Pt is from Home, admitted with hyperkalemia, bradycardia, still has low potassium, which precludes a safe discharge. Discharge to home, when clinically stable, may need few days to improve.  Subjective: No significant events overnight, patient is feeling improvement, denies any diarrhea, no chest pain or palpitation, no shortness of breath.  No any other active issues.    Physical Exam:  General: NAD, lying comfortably Appear in no distress, affect appropriate Eyes: PERRLA ENT: Oral Mucosa Clear, moist  Neck: no JVD,  Cardiovascular: S1 and S2 Present, no Murmur,  Respiratory: good respiratory  effort, Bilateral Air entry equal and Decreased, no Crackles, no wheezes Abdomen: Bowel Sound present, Soft and no tenderness,  Skin: no rashes Extremities: no Pedal edema, no calf tenderness Neurologic: without any new focal findings Gait not checked due to patient safety concerns  Vitals:   03/28/23 2356 03/29/23 0500 03/29/23 0520 03/29/23 1124  BP: 137/61  133/71 129/64  Pulse: 73  65 79  Resp: 20  20 17   Temp: 99 F (37.2 C)  98.7 F (37.1 C) 98.1 F (36.7 C)  TempSrc:   Oral   SpO2: 97%  97% 98%  Weight:  72 kg    Height:        Intake/Output Summary (Last 24 hours) at 03/29/2023 1236 Last data filed at 03/29/2023 1046 Gross per 24 hour  Intake 120 ml  Output --  Net 120 ml   Filed Weights   03/27/23 1513 03/29/23 0500  Weight: 73 kg 72 kg    Data Reviewed: I have personally reviewed and interpreted daily labs, tele strips, imagings as discussed above. I reviewed all nursing notes, pharmacy notes, vitals, pertinent old records I have discussed plan of care as described above with RN and patient/family.  CBC: Recent Labs  Lab 03/27/23 1539 03/28/23 0504 03/29/23 0442  WBC 7.0 7.0 7.2  HGB 10.7* 10.1* 9.4*  HCT 32.9* 31.2* 29.9*  MCV 91.6 92.6 91.4  PLT 309 281 284   Basic Metabolic Panel: Recent Labs  Lab 03/27/23 1539 03/28/23 0504 03/28/23 1431 03/29/23 0442  NA 133* 142 142 142  K <2.0* 2.3* 3.0* 3.0*  CL 89* 99 98 97*  CO2 33* 33* 33* 29  GLUCOSE 108* 113* 128* 132*  BUN 14 10 10 12   CREATININE 0.64 0.57 0.58 0.59  CALCIUM 9.6 9.2 9.5 8.7*  MG 2.2  --   --  2.1  PHOS 3.5  --   --  2.9    Studies: US THYROID  Result Date: 03/29/2023 CLINICAL DATA:  Other.  Thyroid dysfunction. EXAM: THYROID ULTRASOUND TECHNIQUE: Ultrasound examination of the thyroid gland and adjacent soft tissues was performed. COMPARISON:  None Available. FINDINGS: Parenchymal Echotexture: Normal Isthmus: Normal in size measuring 0.3 cm in diameter Right lobe: Normal in  size measuring 5.1 x 2.4 x 1.8 cm Left lobe: Normal in size measuring 4.3 x 1.9 x 1.7 cm _________________________________________________________ Estimated total number of nodules >/= 1 cm: 4 Number of spongiform nodules >/=  2 cm not described below (TR1): 0 Number of mixed cystic and solid nodules >/= 1.5 cm not described below (TR2): 0 _________________________________________________________ There is a 1.5 x 1.0 x 0.7 cm spongiform/benign-appearing nodule within the mid, inferior aspect of the right lobe of the thyroid (labeled 1), which does not meet criteria to recommend percutaneous sampling or continued dedicated follow-up. There is a 1.8 x 1.3 x 1.0 cm spongiform/benign-appearing nodule within mid aspect of the right lobe of the thyroid (labeled 2), which does not meet criteria to recommend percutaneous sampling or continued dedicated follow-up. There is a 0.8 x 0.8 x 0.7 cm isoechoic nodule/pseudonodule within the superior pole of the right lobe of the thyroid (labeled 3), as well as an adjacent 1.2 x 0.8 x 0.8 cm isoechoic nodule/pseudonodule (labeled 4), neither of which meet imaging criteria to recommend percutaneous sampling or continued  dedicated follow-up. _________________________________________________________ There is a 1.5 x 1.2 x 1.1 cm isoechoic spongiform/benign-appearing nodule within mid aspect of the left lobe of the thyroid (labeled 5) which does not meet criteria to recommend percutaneous sampling or continued dedicated follow-up. IMPRESSION: 1. Findings suggestive of multinodular goiter. 2. None of the discretely measured thyroid nodules/pseudo nodules meet imaging criteria to recommend percutaneous sampling or continued dedicated follow-up. The above is in keeping with the ACR TI-RADS recommendations - J Am Coll Radiol 2017;14:587-595. Electronically Signed   By: Simonne Come M.D.   On: 03/29/2023 10:39   ECHOCARDIOGRAM COMPLETE  Result Date: 03/28/2023    ECHOCARDIOGRAM REPORT    Patient Name:   NAIDELIN GUGLIOTTA Date of Exam: 03/28/2023 Medical Rec #:  [id number]      Height:       63.0 in Accession #:    [id number]     Weight:       161.0 lb Date of Birth:  1936-07-28      BSA:          1.763 m Patient Age:    86 years       BP:           140/77 mmHg Patient Gender: F              HR:           74 bpm. Exam Location:  [facility] Procedure: 2D Echo                                 MODIFIED REPORT:   This report was modified by Alwyn Pea MD on 03/28/2023 due to global                                    hypokesis.  Indications:     CHF I50.21  History:         Patient has no prior history of Echocardiogram examinations.  Sonographer:     Overton Mam RDCS, FASE Referring Phys:  [id number] Gillis Santa Diagnosing Phys: Alwyn Pea MD IMPRESSIONS  1. Left ventricular ejection fraction, by estimation, is 40 to 45%. The left ventricle has mildly decreased function. The left ventricle demonstrates global hypokinesis. Left ventricular diastolic parameters are consistent with Grade I diastolic dysfunction (impaired relaxation).  2. Right ventricular systolic function is low normal. The right ventricular size is mildly enlarged.  3. The mitral valve is grossly normal. Trivial mitral valve regurgitation.  4. The aortic valve is grossly normal. Aortic valve regurgitation is trivial. FINDINGS  Left Ventricle: Left ventricular ejection fraction, by estimation, is 40 to 45%. The left ventricle has mildly decreased function. The left ventricle demonstrates global hypokinesis. The left ventricular internal cavity size was normal in size. There is  no left ventricular hypertrophy. Left ventricular diastolic parameters are consistent with Grade I diastolic dysfunction (impaired relaxation). Right Ventricle: The right ventricular size is mildly enlarged. No increase in right ventricular wall thickness. Right ventricular systolic function is low normal. Left Atrium: Left atrial size was normal in size. Right  Atrium: Right atrial size was normal in size. Pericardium: There is no evidence of pericardial effusion. Mitral Valve: The mitral valve is grossly normal. Trivial mitral valve regurgitation. Tricuspid Valve: The tricuspid valve is grossly normal. Tricuspid valve regurgitation is mild. Aortic Valve: The aortic valve is grossly normal.  Aortic valve regurgitation is trivial. Aortic valve peak gradient measures 8.3 mmHg. Pulmonic Valve: The pulmonic valve was normal in structure. Pulmonic valve regurgitation is not visualized. Aorta: The ascending aorta was not well visualized. IAS/Shunts: No atrial level shunt detected by color flow Doppler.  LEFT VENTRICLE PLAX 2D LVIDd:         4.80 cm      Diastology LVIDs:         3.80 cm      LV e' medial:    5.77 cm/s LV PW:         1.10 cm      LV E/e' medial:  10.6 LV IVS:        0.90 cm      LV e' lateral:   7.12 cm/s LVOT diam:     1.90 cm      LV E/e' lateral: 8.6 LV SV:         55 LV SV Index:   31 LVOT Area:     2.84 cm  LV Volumes (MOD) LV vol d, MOD A2C: 111.0 ml LV vol d, MOD A4C: 108.0 ml LV vol s, MOD A2C: 68.3 ml LV vol s, MOD A4C: 53.4 ml LV SV MOD A2C:     42.7 ml LV SV MOD A4C:     108.0 ml LV SV MOD BP:      49.8 ml RIGHT VENTRICLE RV Basal diam:  3.30 cm RV S prime:     14.50 cm/s TAPSE (M-mode): 2.2 cm LEFT ATRIUM             Index        RIGHT ATRIUM           Index LA diam:        3.70 cm 2.10 cm/m   RA Area:     11.80 cm LA Vol (A2C):   50.6 ml 28.70 ml/m  RA Volume:   28.70 ml  16.28 ml/m LA Vol (A4C):   50.5 ml 28.64 ml/m LA Biplane Vol: 51.9 ml 29.43 ml/m  AORTIC VALVE                 PULMONIC VALVE AV Area (Vmax): 2.03 cm     PV Vmax:       1.27 m/s AV Vmax:        144.00 cm/s  PV Peak grad:  6.5 mmHg AV Peak Grad:   8.3 mmHg LVOT Vmax:      103.00 cm/s LVOT Vmean:     62.600 cm/s LVOT VTI:       0.194 m  AORTA Ao Root diam: 2.90 cm Ao Asc diam:  3.00 cm MITRAL VALVE                TRICUSPID VALVE MV Area (PHT): 2.57 cm     TR Peak grad:   31.6  mmHg MV Decel Time: 295 msec     TR Vmax:        281.00 cm/s MV E velocity: 61.30 cm/s MV A velocity: 112.00 cm/s  SHUNTS MV E/A ratio:  0.55         Systemic VTI:  0.19 m                             Systemic Diam: 1.90 cm Dwayne Salome Arnt MD Electronically signed by Alwyn Pea MD Signature Date/Time: 03/28/2023/6:28:45 PM    Final (Updated)  Scheduled Meds:  vitamin C  500 mg Oral Daily   feeding supplement  237 mL Oral BID BM   heparin  5,000 Units Subcutaneous Q8H   irbesartan  150 mg Oral Daily   iron polysaccharides  150 mg Oral Daily   methIMAzole  5 mg Oral Daily   potassium chloride  40 mEq Oral TID   sodium chloride flush  3 mL Intravenous Q12H   Continuous Infusions:   PRN Meds: acetaminophen **OR** acetaminophen, hydrALAZINE  Time spent: 50 minutes  Author: Gillis Santa. MD [facility] 03/29/2023 12:36 PM  To reach On-call, see care teams to locate the attending and reach out to them via [email or web address]. If 7PM-7AM, please contact night-coverage If you still have difficulty reaching the attending provider, please page the [facility] (Director on Call) for [facility] on amion for assistance.

## 2023-03-29 NOTE — Consult Note (Signed)
PHARMACY CONSULT NOTE - ELECTROLYTES  Pharmacy Consult for Electrolyte Monitoring and Replacement   Recent Labs: Potassium (mmol/L)  Date Value  03/29/2023 3.0 (L)   Magnesium (mg/dL)  Date Value  16/07/9603 2.1   Calcium (mg/dL)  Date Value  54/06/8118 8.7 (L)   Albumin (g/dL)  Date Value  14/78/2956 3.2 (L)   Phosphorus (mg/dL)  Date Value  21/30/8657 2.9   Sodium (mmol/L)  Date Value  03/29/2023 142    Assessment  Theresa [name] is a 87 y.o. female presenting with weakness. PMH significant for HTN, HLD, DJD. Holding hydrochlorothiazide. Pharmacy has been consulted to monitor and replace electrolytes.  Diet: regular Pertinent medications: irbesartan  Goal of Therapy: Electrolytes WNL  Plan:  Kcl 40 mEq PO x 3 F/u with AM labs.   Thank you for allowing pharmacy to be a part of this patient's care.  Paschal Dopp, PharmD Clinical Pharmacist 03/29/2023 8:32 AM

## 2023-03-30 DIAGNOSIS — R531 Weakness: Secondary | ICD-10-CM | POA: Diagnosis not present

## 2023-03-30 LAB — T4, FREE: Free T4: 1.43 ng/dL — ABNORMAL HIGH (ref 0.61–1.12)

## 2023-03-30 LAB — CBC
HCT: 29.1 % — ABNORMAL LOW (ref 36.0–46.0)
Hemoglobin: 9.4 g/dL — ABNORMAL LOW (ref 12.0–15.0)
MCH: 30.2 pg (ref 26.0–34.0)
MCHC: 32.3 g/dL (ref 30.0–36.0)
MCV: 93.6 fL (ref 80.0–100.0)
Platelets: 276 10*3/uL (ref 150–400)
RBC: 3.11 MIL/uL — ABNORMAL LOW (ref 3.87–5.11)
RDW: 13.4 % (ref 11.5–15.5)
WBC: 9.2 10*3/uL (ref 4.0–10.5)
nRBC: 0 % (ref 0.0–0.2)

## 2023-03-30 LAB — MAGNESIUM: Magnesium: 2.2 mg/dL (ref 1.7–2.4)

## 2023-03-30 LAB — BASIC METABOLIC PANEL
Anion gap: 6 (ref 5–15)
BUN: 15 mg/dL (ref 8–23)
CO2: 30 mmol/L (ref 22–32)
Calcium: 9.9 mg/dL (ref 8.9–10.3)
Chloride: 105 mmol/L (ref 98–111)
Creatinine, Ser: 0.61 mg/dL (ref 0.44–1.00)
GFR, Estimated: >60 mL/min (ref >60)
Glucose, Bld: 125 mg/dL — ABNORMAL HIGH (ref 70–99)
Potassium: 4.3 mmol/L (ref 3.5–5.1)
Sodium: 141 mmol/L (ref 135–145)

## 2023-03-30 LAB — PHOSPHORUS: Phosphorus: 2.9 mg/dL (ref 2.5–4.6)

## 2023-03-30 MED ORDER — KETOTIFEN FUMARATE 0.035 % OP SOLN: 1.0000 [drp] | Freq: Two times a day (BID) | OPHTHALMIC | 0 refills | Status: AC

## 2023-03-30 MED ORDER — POLYVINYL ALCOHOL 1.4 % OP SOLN: 1.0000 [drp] | OPHTHALMIC | 0 refills | Status: AC | PRN

## 2023-03-30 MED ORDER — METHIMAZOLE 5 MG PO TABS: 5.0000 mg | ORAL_TABLET | Freq: Every day | ORAL | 1 refills | Status: DC

## 2023-03-30 MED ORDER — POLYVINYL ALCOHOL 1.4 % OP SOLN
1.0000 [drp] | OPHTHALMIC | Status: DC | PRN
  Administered 2023-03-30: 1 [drp] via OPHTHALMIC
  Filled 2023-03-30: qty 15

## 2023-03-30 MED ORDER — ADULT MULTIVITAMIN W/MINERALS CH: 1.0000 | ORAL_TABLET | Freq: Every day | ORAL | Status: DC

## 2023-03-30 MED ORDER — IRBESARTAN 150 MG PO TABS: 150.0000 mg | ORAL_TABLET | Freq: Every day | ORAL | 11 refills | Status: DC

## 2023-03-30 MED ORDER — POLYSACCHARIDE IRON COMPLEX 150 MG PO CAPS: 150.0000 mg | ORAL_CAPSULE | Freq: Every day | ORAL | 2 refills | Status: DC

## 2023-03-30 MED ORDER — ASCORBIC ACID 500 MG PO TABS: 500.0000 mg | ORAL_TABLET | Freq: Every day | ORAL | 2 refills | Status: AC

## 2023-03-30 NOTE — Evaluation (Signed)
Physical Therapy Evaluation Patient Details Name: Theresa [name] MRN: [id number] DOB: [birth year] Today's Date: 03/30/2023  History of Present Illness  Pt is an 87 y.o. female presenting to hospital 6/21 with c/o generalized weakness and dizziness.  Pt admitted with generalized weakness, sinus bradycardia, hypokalemia, anemia, and chronic diarrhea.  PMH includes htn, DM, anemia.  Clinical Impression  Prior to hospital admission, pt was independent with ambulation within home (holds onto furniture as needed) and uses SPC outside/in community; lives in senior apartments; no recent falls reported.  Currently pt is modified independent with bed mobility; independent with transfers; and modified independent ambulating 200 feet with SPC use (L LE gait impairments noted but pt reports baseline).  No loss of balance noted during sessions activities.  Pt reports being at baseline level of functional mobility and does not feel she needs any further physical therapy at this time.  MD, pt's nurse, and TOC updated on pt's status.  PT will sign off.   Recommendations for follow up therapy are one component of a multi-disciplinary discharge planning process, led by the attending physician.  Recommendations may be updated based on patient status, additional functional criteria and insurance authorization.        Assistance Recommended at Discharge None  Patient can return home with the following       Equipment Recommendations None recommended by PT (pt already has SPC at home)  Recommendations for Other Services       Functional Status Assessment Patient has not had a recent decline in their functional status     Precautions / Restrictions Precautions Precautions: None Restrictions Weight Bearing Restrictions: No      Mobility  Bed Mobility Overal bed mobility: Modified Independent Bed Mobility: Supine to Sit, Sit to Supine     Supine to sit: Modified independent (Device/Increase time), HOB  elevated Sit to supine: Modified independent (Device/Increase time), HOB elevated   General bed mobility comments: No difficulties noted    Transfers Overall transfer level: Independent Equipment used: None               General transfer comment: steady transfer from bed    Ambulation/Gait Ambulation/Gait assistance: Modified independent (Device/Increase time) Gait Distance (Feet): 200 Feet Assistive device: Straight cane   Gait velocity: mildly decreased     General Gait Details: step through gait pattern; pt keeping L knee extended and circumducting L LE during ambulation (pt reports it prevents her from having L knee pain); steady ambulation  Stairs Stairs:  (Deferred (pt does not have any stairs to get into her home))          Wheelchair Mobility    Modified Rankin (Stroke Patients Only)       Balance Overall balance assessment: Needs assistance Sitting-balance support: No upper extremity supported, Feet supported Sitting balance-Leahy Scale: Normal Sitting balance - Comments: steady reaching outside BOS   Standing balance support: Single extremity supported, During functional activity Standing balance-Leahy Scale: Good Standing balance comment: steady ambulating with SPC use (pt also able to walk short distances in room without UE support and appearing steady)                             Pertinent Vitals/Pain Pain Assessment Pain Assessment: No/denies pain Vitals (HR and SpO2 on room air) stable and WFL throughout treatment session.    Home Living Family/patient expects to be discharged to:: Private residence Living Arrangements: Alone Available Help at Discharge:  Family;Available PRN/intermittently Type of Home: Apartment Environmental manager living) Home Access: Level entry       Home Layout: One level Home Equipment: Cane - quad;Cane - single point;Toilet riser;Grab bars - tub/shower;Grab bars - toilet;Shower seat      Prior Function  Prior Level of Function : Independent/Modified Independent             Mobility Comments: Independent with ambulation within home (holds onto furniture as needed); uses SPC outside of home; no recent falls reported       Hand Dominance        Extremity/Trunk Assessment   Upper Extremity Assessment Upper Extremity Assessment: Overall WFL for tasks assessed    Lower Extremity Assessment Lower Extremity Assessment:  (at least 4/5 B LE hip flexion, knee flexion/extension, and DF/PF; B knee flexion at least 100 degrees)    Cervical / Trunk Assessment Cervical / Trunk Assessment: Normal  Communication   Communication: No difficulties  Cognition Arousal/Alertness: Awake/alert Behavior During Therapy: WFL for tasks assessed/performed Overall Cognitive Status: Within Functional Limits for tasks assessed                                          General Comments  Nursing cleared pt for participation in physical therapy.  Pt agreeable to PT session.    Exercises     Assessment/Plan    PT Assessment Patient does not need any further PT services  PT Problem List         PT Treatment Interventions      PT Goals (Current goals can be found in the Care Plan section)  Acute Rehab PT Goals Patient Stated Goal: to go home today PT Goal Formulation: With patient Time For Goal Achievement: 04/13/23 Potential to Achieve Goals: Good    Frequency       Co-evaluation               AM-PAC PT "6 Clicks" Mobility  Outcome Measure Help needed turning from your back to your side while in a flat bed without using bedrails?: None Help needed moving from lying on your back to sitting on the side of a flat bed without using bedrails?: None Help needed moving to and from a bed to a chair (including a wheelchair)?: None Help needed standing up from a chair using your arms (e.g., wheelchair or bedside chair)?: None Help needed to walk in hospital room?:  None Help needed climbing 3-5 steps with a railing? : A Little 6 Click Score: 23    End of Session Equipment Utilized During Treatment: Gait belt Activity Tolerance: Patient tolerated treatment well Patient left: in bed;with call bell/phone within reach Nurse Communication: Mobility status;Precautions (Nurse reports no bed alarm needed (pt has been walking around room on her own)) PT Visit Diagnosis: Muscle weakness (generalized) (M62.81)    Time: 8413-2440 PT Time Calculation (min) (ACUTE ONLY): 20 min   Charges:   PT Evaluation $PT Eval Low Complexity: 1 Low         Tyjai Matuszak, PT 03/30/23, 4:23 PM

## 2023-03-30 NOTE — Discharge Summary (Signed)
[facility] Discharge Summary   Patient: Theresa [name] ZOX:[id number]  PCP: Mick Sell, MD  Date of admission: 03/27/2023   Date of discharge:  03/30/2023     Discharge Diagnoses:  Principal Problem:   Generalized weakness Active Problems:   Hypokalemia   Sinus bradycardia   Essential hypertension, benign   Diarrhea   Anemia   Admitted From: Home Disposition:  Home   Recommendations for Outpatient Follow-up:  Follow-up with PCP in 1 week, repeat BMP after 1 week Follow-up with endocrinologist in 1 to 2 weeks, repeat thyroid function test and further management of hypothyroidism as an outpatient.  Follow thyroid antibodies Follow-up with a cardiologist in few weeks for further management of CHF as an outpatient and repeat 2D echocardiogram after 4 to 6 months.  Patient may need ischemic cardiac workup/cardiac cath Follow up LABS/TEST: BMP in 1 wk and TTE     Follow-up Information     Callwood, Dwayne D, MD Follow up in 2 week(s).   Specialties: Cardiology, Internal Medicine Contact information: [address] [place] [address] [phone]         Mick Sell, MD Follow up in 1 week(s).   Specialty: Infectious Diseases Contact information: [address] [place] [address] [phone]                Diet recommendation: Cardiac diet  Activity: The patient is advised to gradually reintroduce usual activities, as tolerated  Discharge Condition: stable  Code Status: Full code   History of present illness: As per the H and P dictated on admission. Hospital Course:  Theresa [name] is a 87 y.o. female with medical history significant for hypertension, diabetes mellitus type 2, anemia presenting for generalized weakness and dizziness.   Patient states that for nearly about 2 to 3 weeks she has been weak off of off balance. Patient states she does have chronic diarrhea and has been going to Dr. Tonna Boehringer who is going to do  a colonoscopy on her and has made a referral to cardiology for her low heart rate. Patient went to her PCP earlier in blood results came back today was told her potassium was very low when to go to the hospital.  Patient does have a history of GI bleeding in the past, no active bleeding at this time.  Patient has chronic diarrhea off and on due to stomach upset, she goes to the bathroom whenever she eats.  Patient was given IBD diet. ED workup: Hypertensive BP 161/62, bradycardic heart rate 41 Hypokalemia potassium <2.0, mag and Phos within normal range BNP 696 slightly elevated   Assessment and Plan: # Hypokalemia most likely secondary to chronic diarrhea, HCTZ and GI prep done for colonoscopy. Potassium was 2.0 on admission. Potassium was repleted IV and orally K 4.3 today wnl.  Resumed HCTZ and potassium supplement on discharge.  Patient was advised to follow-up with PCP and repeat BMP after 1 week. # Hypertension with bradycardia, Heart rate improving, bradycardia resolved.  BP is well-controlled now and heart rate improved to 80s. Resumed irbesartan 150 mg p.o. daily home dose. Held hydrochlorothiazide due to hypokalemia but resumed on discharge with potassium supplement.  Patient was advised to monitor BP at home and follow with PCP. # Systolic CHF, unknown acuity, no prior 2D echocardiogram. Well completed, no exacerbation noticed. TTE shows LVEF 40 to 45%, global hypokinesis, grade 1 diastolic dysfunction. Continue irbesartan home dose.  Resume HCTZ with potassium supplement on discharge. Follow-up  with cardiology and repeat 2D echocardiogram after 4 to 6 months # Hyperthyroidism: New diagnosis.  TSH 1.77 within normal range, free T4 level 1.57 --1.41 Slightly elevated. On 6/23 started methimazole 5 mg p.o. daily. US thyroid: 1. Findings suggestive of multinodular goiter. 2. None of the discretely measured thyroid nodules/pseudo nodules meet imaging criteria to recommend percutaneous sampling or  continued dedicated follow-up. Follow thyroid antibodies. Patient was advised to follow-up with PCP and endocrinologist as an outpatient for further management. Follow repeat free T4 level.  # Chronic diarrhea: No more diarrhea since admission # Anemia of chronic disease: Folate within normal range, B12 levels are elevated Patient was advised to stop taking vitamin B12 level for now. Transferrin saturation 9%, slightly low, started oral iron supplement with vitamin C.   Body mass index is 28.27 kg/m.  Nutrition Problem: Increased nutrient needs Etiology: acute illness Nutrition Interventions: Interventions: MVI, Liberalize Diet   Patient was ambulatory without any assistance. On the day of the discharge the patient's vitals were stable, and no other acute medical condition were reported by patient. the patient was felt safe to be discharge at Home.  Consultants: None Procedures: None  Discharge Exam: General: Appear in no distress, no Rash; Oral Mucosa Clear, moist. Cardiovascular: S1 and S2 Present, no Murmur, Respiratory: normal respiratory effort, Bilateral Air entry present and no Crackles, no wheezes Abdomen: Bowel Sound present, Soft and no tenderness, no hernia Extremities: no Pedal edema, no calf tenderness Neurology: alert and oriented to time, place, and person affect appropriate.  Filed Weights   03/27/23 1513 03/29/23 0500 03/30/23 0500  Weight: 73 kg 72 kg 72.4 kg   Vitals:   03/30/23 0756 03/30/23 1123  BP: 137/69 133/61  Pulse: 73 64  Resp:  18  Temp:  98.5 F (36.9 C)  SpO2:  100%    DISCHARGE MEDICATION: Allergies as of 03/30/2023       Reactions   Sitagliptin Nausea Only        Medication List     STOP taking these medications    cyanocobalamin 1000 MCG tablet Commonly known as: VITAMIN B12       TAKE these medications    Alfalfa 500 MG Tabs Take 1 tablet by mouth as directed.   ascorbic acid 500 MG tablet Commonly known as: VITAMIN  C Take 1 tablet (500 mg total) by mouth daily. Start taking on: March 31, 2023   docusate sodium 100 MG capsule Commonly known as: COLACE Take 100 mg by mouth at bedtime.   hydrochlorothiazide 25 MG tablet Commonly known as: HYDRODIURIL Take 1 tablet (25 mg total) by mouth daily.   irbesartan 150 MG tablet Commonly known as: Avapro Take 1 tablet (150 mg total) by mouth daily. What changed:  medication strength how much to take   iron polysaccharides 150 MG capsule Commonly known as: NIFEREX Take 1 capsule (150 mg total) by mouth daily. Start taking on: March 31, 2023   ketotifen 0.035 % ophthalmic solution Commonly known as: ZADITOR Place 1 drop into both eyes in the morning and at bedtime for 10 days.   magnesium oxide 400 (240 Mg) MG tablet Commonly known as: MAG-OX Take 400 mg by mouth at bedtime.   methimazole 5 MG tablet Commonly known as: TAPAZOLE Take 1 tablet (5 mg total) by mouth daily. Start taking on: March 31, 2023   Multivital tablet Take 1 tablet by mouth daily.   OVER THE COUNTER MEDICATION Take 2-3 tablets by mouth as directed. OsteoMatrix  by Enrique Sack   polyvinyl alcohol 1.4 % ophthalmic solution Commonly known as: LIQUIFILM TEARS Place 1 drop into both eyes as needed for dry eyes.   potassium chloride 10 MEQ tablet Commonly known as: KLOR-CON Take 20 mEq by mouth at bedtime.   Vitamin D-3 125 MCG (5000 UT) Tabs Take 5,000 Units by mouth daily.       Allergies  Allergen Reactions   Sitagliptin Nausea Only   Discharge Instructions     Ambulatory referral to Endocrinology   Complete by: As directed    Call MD for:  difficulty breathing, headache or visual disturbances   Complete by: As directed    Call MD for:  extreme fatigue   Complete by: As directed    Call MD for:  persistant dizziness or light-headedness   Complete by: As directed    Call MD for:  persistant nausea and vomiting   Complete by: As directed    Call MD for:  severe  uncontrolled pain   Complete by: As directed    Call MD for:  temperature >100.4   Complete by: As directed    Diet - low sodium heart healthy   Complete by: As directed    Fluid restriction 1.5 L/day   Discharge instructions   Complete by: As directed    Follow-up with PCP in 1 week, repeat BMP after 1 week Follow-up with endocrinologist in 1 to 2 weeks, repeat thyroid function test and further management of hypothyroidism as an outpatient.  Follow thyroid antibodies Follow-up with a cardiologist in few weeks for further management of CHF as an outpatient and repeat 2D echocardiogram after 4 to 6 months.  Patient may need ischemic cardiac workup/cardiac cath   Increase activity slowly   Complete by: As directed        The results of significant diagnostics from this hospitalization (including imaging, microbiology, ancillary and laboratory) are listed below for reference.    Significant Diagnostic Studies: US THYROID  Result Date: 03/29/2023 CLINICAL DATA:  Other.  Thyroid dysfunction. EXAM: THYROID ULTRASOUND TECHNIQUE: Ultrasound examination of the thyroid gland and adjacent soft tissues was performed. COMPARISON:  None Available. FINDINGS: Parenchymal Echotexture: Normal Isthmus: Normal in size measuring 0.3 cm in diameter Right lobe: Normal in size measuring 5.1 x 2.4 x 1.8 cm Left lobe: Normal in size measuring 4.3 x 1.9 x 1.7 cm _________________________________________________________ Estimated total number of nodules >/= 1 cm: 4 Number of spongiform nodules >/=  2 cm not described below (TR1): 0 Number of mixed cystic and solid nodules >/= 1.5 cm not described below (TR2): 0 _________________________________________________________ There is a 1.5 x 1.0 x 0.7 cm spongiform/benign-appearing nodule within the mid, inferior aspect of the right lobe of the thyroid (labeled 1), which does not meet criteria to recommend percutaneous sampling or continued dedicated follow-up. There is a 1.8 x  1.3 x 1.0 cm spongiform/benign-appearing nodule within mid aspect of the right lobe of the thyroid (labeled 2), which does not meet criteria to recommend percutaneous sampling or continued dedicated follow-up. There is a 0.8 x 0.8 x 0.7 cm isoechoic nodule/pseudonodule within the superior pole of the right lobe of the thyroid (labeled 3), as well as an adjacent 1.2 x 0.8 x 0.8 cm isoechoic nodule/pseudonodule (labeled 4), neither of which meet imaging criteria to recommend percutaneous sampling or continued dedicated follow-up. _________________________________________________________ There is a 1.5 x 1.2 x 1.1 cm isoechoic spongiform/benign-appearing nodule within mid aspect of the left lobe of the thyroid (labeled 5) which  does not meet criteria to recommend percutaneous sampling or continued dedicated follow-up. IMPRESSION: 1. Findings suggestive of multinodular goiter. 2. None of the discretely measured thyroid nodules/pseudo nodules meet imaging criteria to recommend percutaneous sampling or continued dedicated follow-up. The above is in keeping with the ACR TI-RADS recommendations - J Am Coll Radiol 2017;14:587-595. Electronically Signed   By: Simonne Come M.D.   On: 03/29/2023 10:39   ECHOCARDIOGRAM COMPLETE  Result Date: 03/28/2023    ECHOCARDIOGRAM REPORT   Patient Name:   ALAYJA ARMAS Date of Exam: 03/28/2023 Medical Rec #:  [id number]      Height:       63.0 in Accession #:    [id number]     Weight:       161.0 lb Date of Birth:  [birth year]      BSA:          1.763 m Patient Age:    86 years       BP:           140/77 mmHg Patient Gender: F              HR:           74 bpm. Exam Location:  [facility] Procedure: 2D Echo                                 MODIFIED REPORT:   This report was modified by Alwyn Pea MD on 03/28/2023 due to global                                    hypokesis.  Indications:     CHF I50.21  History:         Patient has no prior history of Echocardiogram examinations.   Sonographer:     Overton Mam RDCS, FASE Referring Phys:  [id number] Gillis Santa Diagnosing Phys: Alwyn Pea MD IMPRESSIONS  1. Left ventricular ejection fraction, by estimation, is 40 to 45%. The left ventricle has mildly decreased function. The left ventricle demonstrates global hypokinesis. Left ventricular diastolic parameters are consistent with Grade I diastolic dysfunction (impaired relaxation).  2. Right ventricular systolic function is low normal. The right ventricular size is mildly enlarged.  3. The mitral valve is grossly normal. Trivial mitral valve regurgitation.  4. The aortic valve is grossly normal. Aortic valve regurgitation is trivial. FINDINGS  Left Ventricle: Left ventricular ejection fraction, by estimation, is 40 to 45%. The left ventricle has mildly decreased function. The left ventricle demonstrates global hypokinesis. The left ventricular internal cavity size was normal in size. There is  no left ventricular hypertrophy. Left ventricular diastolic parameters are consistent with Grade I diastolic dysfunction (impaired relaxation). Right Ventricle: The right ventricular size is mildly enlarged. No increase in right ventricular wall thickness. Right ventricular systolic function is low normal. Left Atrium: Left atrial size was normal in size. Right Atrium: Right atrial size was normal in size. Pericardium: There is no evidence of pericardial effusion. Mitral Valve: The mitral valve is grossly normal. Trivial mitral valve regurgitation. Tricuspid Valve: The tricuspid valve is grossly normal. Tricuspid valve regurgitation is mild. Aortic Valve: The aortic valve is grossly normal. Aortic valve regurgitation is trivial. Aortic valve peak gradient measures 8.3 mmHg. Pulmonic Valve: The pulmonic valve was normal in structure. Pulmonic valve regurgitation is not visualized. Aorta: The  ascending aorta was not well visualized. IAS/Shunts: No atrial level shunt detected by color flow Doppler.   LEFT VENTRICLE PLAX 2D LVIDd:         4.80 cm      Diastology LVIDs:         3.80 cm      LV e' medial:    5.77 cm/s LV PW:         1.10 cm      LV E/e' medial:  10.6 LV IVS:        0.90 cm      LV e' lateral:   7.12 cm/s LVOT diam:     1.90 cm      LV E/e' lateral: 8.6 LV SV:         55 LV SV Index:   31 LVOT Area:     2.84 cm  LV Volumes (MOD) LV vol d, MOD A2C: 111.0 ml LV vol d, MOD A4C: 108.0 ml LV vol s, MOD A2C: 68.3 ml LV vol s, MOD A4C: 53.4 ml LV SV MOD A2C:     42.7 ml LV SV MOD A4C:     108.0 ml LV SV MOD BP:      49.8 ml RIGHT VENTRICLE RV Basal diam:  3.30 cm RV S prime:     14.50 cm/s TAPSE (M-mode): 2.2 cm LEFT ATRIUM             Index        RIGHT ATRIUM           Index LA diam:        3.70 cm 2.10 cm/m   RA Area:     11.80 cm LA Vol (A2C):   50.6 ml 28.70 ml/m  RA Volume:   28.70 ml  16.28 ml/m LA Vol (A4C):   50.5 ml 28.64 ml/m LA Biplane Vol: 51.9 ml 29.43 ml/m  AORTIC VALVE                 PULMONIC VALVE AV Area (Vmax): 2.03 cm     PV Vmax:       1.27 m/s AV Vmax:        144.00 cm/s  PV Peak grad:  6.5 mmHg AV Peak Grad:   8.3 mmHg LVOT Vmax:      103.00 cm/s LVOT Vmean:     62.600 cm/s LVOT VTI:       0.194 m  AORTA Ao Root diam: 2.90 cm Ao Asc diam:  3.00 cm MITRAL VALVE                TRICUSPID VALVE MV Area (PHT): 2.57 cm     TR Peak grad:   31.6 mmHg MV Decel Time: 295 msec     TR Vmax:        281.00 cm/s MV E velocity: 61.30 cm/s MV A velocity: 112.00 cm/s  SHUNTS MV E/A ratio:  0.55         Systemic VTI:  0.19 m                             Systemic Diam: 1.90 cm Dwayne Salome Arnt MD Electronically signed by Alwyn Pea MD Signature Date/Time: 03/28/2023/6:28:45 PM    Final (Updated)    MM 3D SCREENING MAMMOGRAM BILATERAL BREAST  Result Date: 03/04/2023 CLINICAL DATA:  Screening. EXAM: DIGITAL SCREENING BILATERAL MAMMOGRAM WITH TOMOSYNTHESIS AND CAD TECHNIQUE: Bilateral screening digital craniocaudal  and mediolateral oblique mammograms were obtained. Bilateral screening  digital breast tomosynthesis was performed. The images were evaluated with computer-aided detection. COMPARISON:  Previous exam(s). ACR Breast Density Category a: The breasts are almost entirely fatty. FINDINGS: There are no findings suspicious for malignancy. IMPRESSION: No mammographic evidence of malignancy. A result letter of this screening mammogram will be mailed directly to the patient. RECOMMENDATION: Screening mammogram in one year. (Code:[id number]-B-01Y) BI-RADS CATEGORY  1: Negative. Electronically Signed   By: Edwin Cap M.D.   On: 03/04/2023 15:15    Microbiology: No results found for this or any previous visit (from the past 240 hour(s)).   Labs: CBC: Recent Labs  Lab 03/27/23 1539 03/28/23 0504 03/29/23 0442 03/30/23 0441  WBC 7.0 7.0 7.2 9.2  HGB 10.7* 10.1* 9.4* 9.4*  HCT 32.9* 31.2* 29.9* 29.1*  MCV 91.6 92.6 91.4 93.6  PLT 309 281 284 276   Basic Metabolic Panel: Recent Labs  Lab 03/27/23 1539 03/28/23 0504 03/28/23 1431 03/29/23 0442 03/30/23 0441  NA 133* 142 142 142 141  K <2.0* 2.3* 3.0* 3.0* 4.3  CL 89* 99 98 97* 105  CO2 33* 33* 33* 29 30  GLUCOSE 108* 113* 128* 132* 125*  BUN 14 10 10 12 15   CREATININE 0.64 0.57 0.58 0.59 0.61  CALCIUM 9.6 9.2 9.5 8.7* 9.9  MG 2.2  --   --  2.1 2.2  PHOS 3.5  --   --  2.9 2.9   Liver Function Tests: Recent Labs  Lab 03/27/23 1539 03/28/23 0504  AST 27 27  ALT 14 12  ALKPHOS 50 47  BILITOT 1.2 1.2  PROT 7.1 6.5  ALBUMIN 3.7 3.2*   No results for input(s): "LIPASE", "AMYLASE" in the last 168 hours. No results for input(s): "AMMONIA" in the last 168 hours. Cardiac Enzymes: No results for input(s): "CKTOTAL", "CKMB", "CKMBINDEX", "TROPONINI" in the last 168 hours. BNP (last 3 results) Recent Labs    03/27/23 1539  BNP 696.2*   CBG: No results for input(s): "GLUCAP" in the last 168 hours.  Time spent: 35 minutes  Signed:  Gillis Santa  [facility] 03/30/2023 3:24 PM

## 2023-03-30 NOTE — Progress Notes (Signed)
Patient discharge education and instructions provided. All questions answered. Peripheral IV removed. All belongings sent home with patient.

## 2023-03-30 NOTE — Progress Notes (Signed)
Initial Nutrition Assessment  DOCUMENTATION CODES:   Not applicable  INTERVENTION:   -D/c Ensure Enlive po BID, each supplement provides 350 kcal and 20 grams of protein -Liberalize diet to regular for widest variety of meal selections -MVI with minerals daily  NUTRITION DIAGNOSIS:   Increased nutrient needs related to acute illness as evidenced by estimated needs.  GOAL:   Patient will meet greater than or equal to 90% of their needs  MONITOR:   PO intake, Supplement acceptance  REASON FOR ASSESSMENT:   Malnutrition Screening Tool    ASSESSMENT:   Pt with medical history significant for hypertension, diabetes mellitus type 2, anemia presenting for generalized weakness and dizziness  Pt admitted with hypokalemia secondary to chronic diarrhea, hydrochlorothiazide, and prep done for colonoscopy.    Reviewed I/O's: +360 ml x 24 hours and +460 ml since admission  Spoke with pt at beside, who complains of feeling tired today. Pt reports she has a fair appetite at baseline, but tries ot eat because she knows it is important to keep up her strength. PTA, pt consumes 2 meals per day.   Pt reports eating most of her meals during hospitalization. Noted meal completions 60-100%.   Pt unsure of wt loss. Per CareEverywhere, wt was 165# on 02/16/23. Reviewed wt hx; pt has experienced a 3.2% wt loss over the past month, which is not significant for time frame.   Discussed importance of good meal intake to promote healing. Pt has been refusing Ensure supplements.   Per MD notes, possible discharge home today.   Medications reviewed and include vitamin C.   Labs reviewed: CBGS: 86.    NUTRITION - FOCUSED PHYSICAL EXAM:  Flowsheet Row Most Recent Value  Orbital Region No depletion  Upper Arm Region No depletion  Thoracic and Lumbar Region No depletion  Buccal Region No depletion  Temple Region No depletion  Clavicle Bone Region No depletion  Clavicle and Acromion Bone Region  No depletion  Scapular Bone Region No depletion  Dorsal Hand No depletion  Patellar Region No depletion  Anterior Thigh Region No depletion  Posterior Calf Region No depletion  Edema (RD Assessment) Mild  Hair Reviewed  Eyes Reviewed  Mouth Reviewed  Skin Reviewed  Nails Reviewed       Diet Order:   Diet Order             Diet regular Room service appropriate? Yes; Fluid consistency: Thin  Diet effective now                   EDUCATION NEEDS:   Education needs have been addressed  Skin:  Skin Assessment: Reviewed RN Assessment  Last BM:  03/30/23  Height:   Ht Readings from Last 1 Encounters:  03/27/23 5\' 3"  (1.6 m)    Weight:   Wt Readings from Last 1 Encounters:  03/30/23 72.4 kg    Ideal Body Weight:  52.3 kg  BMI:  Body mass index is 28.27 kg/m.  Estimated Nutritional Needs:   Kcal:  1600-1800  Protein:  85-100 grams  Fluid:  > 1.6 L    Levada Schilling, RD, LDN, CDCES Registered Dietitian II Certified Diabetes Care and Education Specialist Please refer to [facility] for RD and/or RD on-call/weekend/after hours pager

## 2023-03-30 NOTE — TOC CM/SW Note (Signed)
Transition of Care [facility]) - Inpatient Brief Assessment   Patient Details  Name: Theresa [name] MRN: [id number] Date of Birth: 17-Aug-1936  [facility]) CM/SW Contact:    Allena Katz, LCSW Phone Number: 03/30/2023, 1:56 PM   Clinical Narrative:    Transition of Care Asessment: Insurance and Status: Insurance coverage has been reviewed Patient has primary care physician: Yes Home environment has been reviewed: [address] Prior level of function:: walks with cane. Per Nursing, no PT/OT needs. Prior/Current Home Services: No current home services Social Determinants of Health Reivew: SDOH reviewed no interventions necessary Readmission risk has been reviewed: Yes Transition of care needs: no transition of care needs at this time

## 2023-03-30 NOTE — Consult Note (Addendum)
PHARMACY CONSULT NOTE - ELECTROLYTES  Pharmacy Consult for Electrolyte Monitoring and Replacement   Recent Labs: Potassium (mmol/L)  Date Value  03/30/2023 4.3   Magnesium (mg/dL)  Date Value  29/52/8413 2.2   Calcium (mg/dL)  Date Value  24/40/1027 9.9   Albumin (g/dL)  Date Value  25/36/6440 3.2 (L)   Phosphorus (mg/dL)  Date Value  34/74/2595 2.9   Sodium (mmol/L)  Date Value  03/30/2023 141    Assessment  Theresa [name] is a 87 y.o. female presenting with weakness. PMH significant for HTN, HLD, DJD. Holding hydrochlorothiazide. Pharmacy has been consulted to monitor and replace electrolytes.  Diet: regular Pertinent medications: irbesartan  Goal of Therapy: Electrolytes within normal limits  Plan:  K+ 4.3. All other electrolytes within normal limits. No replacement warranted today.  BMP tomorrow AM  Thank you for allowing pharmacy to be a part of this patient's care.   Elliot Gurney, PharmD, BCPS Clinical Pharmacist  03/30/2023 7:18 AM

## 2023-03-31 ENCOUNTER — Telehealth: Payer: Self-pay

## 2023-03-31 LAB — THYROID ANTIBODIES
Thyroglobulin Antibody: <1 IU/mL (ref 0.0–0.9)
Thyroperoxidase Ab SerPl-aCnc: 218 IU/mL — ABNORMAL HIGH (ref 0–34)

## 2023-03-31 NOTE — Telephone Encounter (Signed)
Pt is calling about referral that was sent over to [place]. Can you please assist with this? Thanks

## 2023-05-26 ENCOUNTER — Other Ambulatory Visit: Payer: Self-pay | Admitting: Infectious Diseases

## 2023-05-26 DIAGNOSIS — R195 Other fecal abnormalities: Secondary | ICD-10-CM

## 2023-05-26 DIAGNOSIS — K529 Noninfective gastroenteritis and colitis, unspecified: Secondary | ICD-10-CM

## 2023-06-05 ENCOUNTER — Ambulatory Visit
Admission: RE | Admit: 2023-06-05 | Discharge: 2023-06-05 | Disposition: A | Discharge status: Home / Self Care | Payer: Medicare Other | Source: Ambulatory Visit | Attending: Infectious Diseases | Admitting: Infectious Diseases

## 2023-06-05 DIAGNOSIS — R195 Other fecal abnormalities: Secondary | ICD-10-CM

## 2023-06-05 DIAGNOSIS — K529 Noninfective gastroenteritis and colitis, unspecified: Secondary | ICD-10-CM | POA: Diagnosis not present

## 2023-06-05 MED ORDER — IOHEXOL 300 MG/ML  SOLN
100.0000 mL | Freq: Once | INTRAMUSCULAR | Status: AC | PRN
  Administered 2023-06-05: 80 mL via INTRAVENOUS

## 2023-07-14 ENCOUNTER — Other Ambulatory Visit: Payer: Medicare Other

## 2023-07-21 ENCOUNTER — Ambulatory Visit: Payer: Medicare Other | Admitting: "Endocrinology

## 2023-09-28 ENCOUNTER — Encounter: Payer: Self-pay | Admitting: Internal Medicine

## 2023-10-09 ENCOUNTER — Encounter: Payer: Self-pay | Admitting: Internal Medicine

## 2023-10-27 ENCOUNTER — Encounter: Admission: RE | Payer: Self-pay | Source: Home / Self Care

## 2023-10-27 ENCOUNTER — Ambulatory Visit: Admission: RE | Admit: 2023-10-27 | Payer: Medicare Other | Source: Home / Self Care | Admitting: Internal Medicine

## 2023-10-27 HISTORY — DX: Other specified disorders of bone density and structure, unspecified site: M85.80

## 2023-10-27 HISTORY — DX: Vitamin D deficiency, unspecified: E55.9

## 2023-10-27 HISTORY — DX: Deficiency of other specified B group vitamins: E53.8

## 2023-10-27 HISTORY — DX: Trochanteric bursitis, right hip: M70.61

## 2023-10-27 HISTORY — DX: Systemic lupus erythematosus, unspecified: M32.9

## 2023-10-27 HISTORY — DX: Hypokalemia: E87.6

## 2023-10-27 HISTORY — DX: Unspecified osteoarthritis, unspecified site: M19.90

## 2023-10-27 HISTORY — DX: Nontoxic single thyroid nodule: E04.1

## 2023-10-27 SURGERY — COLONOSCOPY
Anesthesia: General

## 2023-11-11 ENCOUNTER — Other Ambulatory Visit
Admission: RE | Admit: 2023-11-11 | Discharge: 2023-11-11 | Disposition: A | Discharge status: Home / Self Care | Payer: Medicare Other | Source: Ambulatory Visit | Attending: Internal Medicine | Admitting: Internal Medicine

## 2023-11-11 DIAGNOSIS — E785 Hyperlipidemia, unspecified: Secondary | ICD-10-CM | POA: Diagnosis present

## 2023-11-11 DIAGNOSIS — R0602 Shortness of breath: Secondary | ICD-10-CM | POA: Insufficient documentation

## 2023-11-11 LAB — BRAIN NATRIURETIC PEPTIDE: B Natriuretic Peptide: 2023 pg/mL — ABNORMAL HIGH (ref 0.0–100.0)

## 2023-11-13 ENCOUNTER — Other Ambulatory Visit: Payer: Self-pay | Admitting: Internal Medicine

## 2023-11-13 DIAGNOSIS — E785 Hyperlipidemia, unspecified: Secondary | ICD-10-CM

## 2023-11-25 ENCOUNTER — Other Ambulatory Visit: Payer: Medicare Other

## 2023-12-01 ENCOUNTER — Ambulatory Visit
Admission: RE | Admit: 2023-12-01 | Discharge: 2023-12-01 | Disposition: A | Discharge status: Home / Self Care | Payer: Self-pay | Source: Ambulatory Visit | Attending: Internal Medicine | Admitting: Internal Medicine

## 2023-12-01 DIAGNOSIS — E785 Hyperlipidemia, unspecified: Secondary | ICD-10-CM

## 2023-12-04 ENCOUNTER — Other Ambulatory Visit: Payer: Self-pay | Admitting: Infectious Diseases

## 2023-12-04 DIAGNOSIS — Z1231 Encounter for screening mammogram for malignant neoplasm of breast: Secondary | ICD-10-CM

## 2023-12-14 ENCOUNTER — Other Ambulatory Visit
Admission: RE | Admit: 2023-12-14 | Discharge: 2023-12-14 | Disposition: A | Discharge status: Home / Self Care | Source: Ambulatory Visit | Attending: Internal Medicine | Admitting: Internal Medicine

## 2023-12-14 DIAGNOSIS — I5032 Chronic diastolic (congestive) heart failure: Secondary | ICD-10-CM | POA: Diagnosis present

## 2023-12-14 DIAGNOSIS — R0602 Shortness of breath: Secondary | ICD-10-CM | POA: Diagnosis present

## 2023-12-14 DIAGNOSIS — R600 Localized edema: Secondary | ICD-10-CM | POA: Insufficient documentation

## 2023-12-14 LAB — BRAIN NATRIURETIC PEPTIDE: B Natriuretic Peptide: 1069.9 pg/mL — ABNORMAL HIGH (ref 0.0–100.0)

## 2024-01-01 ENCOUNTER — Ambulatory Visit
Admission: RE | Admit: 2024-01-01 | Discharge: 2024-01-01 | Disposition: A | Discharge status: Home / Self Care | Attending: Internal Medicine | Admitting: Internal Medicine

## 2024-01-01 ENCOUNTER — Encounter: Payer: Self-pay | Admitting: Internal Medicine

## 2024-01-01 ENCOUNTER — Encounter
Admission: RE | Disposition: A | Discharge status: Home / Self Care | Payer: Self-pay | Source: Home / Self Care | Attending: Internal Medicine

## 2024-01-01 ENCOUNTER — Other Ambulatory Visit: Payer: Self-pay

## 2024-01-01 DIAGNOSIS — I509 Heart failure, unspecified: Secondary | ICD-10-CM | POA: Diagnosis present

## 2024-01-01 DIAGNOSIS — I5022 Chronic systolic (congestive) heart failure: Secondary | ICD-10-CM | POA: Insufficient documentation

## 2024-01-01 DIAGNOSIS — I272 Pulmonary hypertension, unspecified: Secondary | ICD-10-CM | POA: Diagnosis not present

## 2024-01-01 DIAGNOSIS — I2510 Atherosclerotic heart disease of native coronary artery without angina pectoris: Secondary | ICD-10-CM | POA: Diagnosis not present

## 2024-01-01 HISTORY — PX: RIGHT/LEFT HEART CATH AND CORONARY ANGIOGRAPHY: CATH118266

## 2024-01-01 LAB — CBC
HCT: 40.8 % (ref 36.0–46.0)
Hemoglobin: 13.3 g/dL (ref 12.0–15.0)
MCH: 30 pg (ref 26.0–34.0)
MCHC: 32.6 g/dL (ref 30.0–36.0)
MCV: 91.9 fL (ref 80.0–100.0)
Platelets: 162 10*3/uL (ref 150–400)
RBC: 4.44 MIL/uL (ref 3.87–5.11)
RDW: 14.6 % (ref 11.5–15.5)
WBC: 5.6 10*3/uL (ref 4.0–10.5)
nRBC: 0 % (ref 0.0–0.2)

## 2024-01-01 SURGERY — RIGHT/LEFT HEART CATH AND CORONARY ANGIOGRAPHY
Anesthesia: Moderate Sedation | Laterality: Bilateral

## 2024-01-01 MED ORDER — HEPARIN (PORCINE) IN NACL 1000-0.9 UT/500ML-% IV SOLN
INTRAVENOUS | Status: AC
  Filled 2024-01-01: qty 1000

## 2024-01-01 MED ORDER — SODIUM CHLORIDE 0.9 % IV SOLN: INTRAVENOUS | Status: DC

## 2024-01-01 MED ORDER — ASPIRIN 81 MG PO CHEW
CHEWABLE_TABLET | ORAL | Status: AC
  Filled 2024-01-01: qty 1

## 2024-01-01 MED ORDER — VERAPAMIL HCL 2.5 MG/ML IV SOLN
INTRAVENOUS | Status: DC | PRN
  Administered 2024-01-01: 2.5 mg via INTRA_ARTERIAL

## 2024-01-01 MED ORDER — HEPARIN SODIUM (PORCINE) 1000 UNIT/ML IJ SOLN
INTRAMUSCULAR | Status: AC
  Filled 2024-01-01: qty 10

## 2024-01-01 MED ORDER — IOHEXOL 300 MG/ML  SOLN
INTRAMUSCULAR | Status: DC | PRN
  Administered 2024-01-01: 40 mL

## 2024-01-01 MED ORDER — LIDOCAINE HCL 1 % IJ SOLN
INTRAMUSCULAR | Status: AC
  Filled 2024-01-01: qty 20

## 2024-01-01 MED ORDER — SODIUM CHLORIDE 0.9% FLUSH: 3.0000 mL | Freq: Two times a day (BID) | INTRAVENOUS | Status: DC

## 2024-01-01 MED ORDER — SODIUM CHLORIDE 0.9% FLUSH: 3.0000 mL | INTRAVENOUS | Status: DC | PRN

## 2024-01-01 MED ORDER — MIDAZOLAM HCL 2 MG/2ML IJ SOLN
INTRAMUSCULAR | Status: AC
  Filled 2024-01-01: qty 2

## 2024-01-01 MED ORDER — HEPARIN (PORCINE) IN NACL 1000-0.9 UT/500ML-% IV SOLN
INTRAVENOUS | Status: DC | PRN
  Administered 2024-01-01: 1000 mL

## 2024-01-01 MED ORDER — FENTANYL CITRATE (PF) 100 MCG/2ML IJ SOLN
INTRAMUSCULAR | Status: DC | PRN
  Administered 2024-01-01: 12.5 ug via INTRAVENOUS

## 2024-01-01 MED ORDER — VERAPAMIL HCL 2.5 MG/ML IV SOLN
INTRAVENOUS | Status: AC
  Filled 2024-01-01: qty 2

## 2024-01-01 MED ORDER — ASPIRIN 81 MG PO CHEW
81.0000 mg | CHEWABLE_TABLET | ORAL | Status: AC
  Administered 2024-01-01: 81 mg via ORAL

## 2024-01-01 MED ORDER — LIDOCAINE HCL (PF) 1 % IJ SOLN
INTRAMUSCULAR | Status: DC | PRN
  Administered 2024-01-01: 5 mL

## 2024-01-01 MED ORDER — SODIUM CHLORIDE 0.9 % IV SOLN: 250.0000 mL | INTRAVENOUS | Status: DC | PRN

## 2024-01-01 MED ORDER — MIDAZOLAM HCL 2 MG/2ML IJ SOLN
INTRAMUSCULAR | Status: DC | PRN
  Administered 2024-01-01: .5 mg via INTRAVENOUS

## 2024-01-01 MED ORDER — HEPARIN SODIUM (PORCINE) 1000 UNIT/ML IJ SOLN
INTRAMUSCULAR | Status: DC | PRN
  Administered 2024-01-01: 3000 [IU] via INTRAVENOUS

## 2024-01-01 MED ORDER — FENTANYL CITRATE (PF) 100 MCG/2ML IJ SOLN
INTRAMUSCULAR | Status: AC
  Filled 2024-01-01: qty 2

## 2024-01-01 SURGICAL SUPPLY — 17 items
CATH 5FR JL3.5 JR4 ANG PIG MP (CATHETERS) IMPLANT
CATH SWAN GANZ 7F STRAIGHT (CATHETERS) IMPLANT
DEVICE RAD TR BAND REGULAR (VASCULAR PRODUCTS) IMPLANT
DRAPE BRACHIAL (DRAPES) IMPLANT
GLIDESHEATH SLEND SS 6F .021 (SHEATH) IMPLANT
GUIDEWIRE EMER 3M J .025X150CM (WIRE) IMPLANT
GUIDEWIRE INQWIRE 1.5J.035X260 (WIRE) IMPLANT
INQWIRE 1.5J .035X260CM (WIRE) ×1 IMPLANT
KIT SYRINGE INJ CVI SPIKEX1 (MISCELLANEOUS) IMPLANT
NDL PERC 18GX7CM (NEEDLE) IMPLANT
NEEDLE PERC 18GX7CM (NEEDLE) ×1 IMPLANT
PACK CARDIAC CATH (CUSTOM PROCEDURE TRAY) ×1 IMPLANT
PROTECTION STATION PRESSURIZED (MISCELLANEOUS) ×1 IMPLANT
SET ATX-X65L (MISCELLANEOUS) IMPLANT
SHEATH AVANTI 7FRX11 (SHEATH) IMPLANT
SHEATH GLIDE SLENDER 4/5FR (SHEATH) IMPLANT
STATION PROTECTION PRESSURIZED (MISCELLANEOUS) IMPLANT

## 2024-01-04 LAB — POCT I-STAT EG7
Acid-base deficit: 2 mmol/L (ref 0.0–2.0)
Bicarbonate: 22.5 mmol/L (ref 20.0–28.0)
Calcium, Ion: 1.26 mmol/L (ref 1.15–1.40)
HCT: 41 % (ref 36.0–46.0)
Hemoglobin: 13.9 g/dL (ref 12.0–15.0)
O2 Saturation: 67 %
Potassium: 4 mmol/L (ref 3.5–5.1)
Sodium: 139 mmol/L (ref 135–145)
TCO2: 24 mmol/L (ref 22–32)
pCO2, Ven: 38.4 mmHg — ABNORMAL LOW (ref 44–60)
pH, Ven: 7.376 (ref 7.25–7.43)
pO2, Ven: 36 mmHg (ref 32–45)

## 2024-01-04 LAB — POCT I-STAT 7, (LYTES, BLD GAS, ICA,H+H)
Acid-base deficit: 2 mmol/L (ref 0.0–2.0)
Bicarbonate: 22.1 mmol/L (ref 20.0–28.0)
Calcium, Ion: 1.22 mmol/L (ref 1.15–1.40)
HCT: 41 % (ref 36.0–46.0)
Hemoglobin: 13.9 g/dL (ref 12.0–15.0)
O2 Saturation: 97 %
Potassium: 4.1 mmol/L (ref 3.5–5.1)
Sodium: 139 mmol/L (ref 135–145)
TCO2: 23 mmol/L (ref 22–32)
pCO2 arterial: 33.8 mmHg (ref 32–48)
pH, Arterial: 7.423 (ref 7.35–7.45)
pO2, Arterial: 83 mmHg (ref 83–108)

## 2024-01-06 LAB — CARDIAC CATHETERIZATION: Cath EF Quantitative: 35 %

## 2024-01-07 ENCOUNTER — Encounter: Payer: Self-pay | Admitting: Internal Medicine

## 2024-01-27 ENCOUNTER — Other Ambulatory Visit
Admission: RE | Admit: 2024-01-27 | Discharge: 2024-01-27 | Disposition: A | Discharge status: Home / Self Care | Source: Ambulatory Visit | Attending: Internal Medicine | Admitting: Internal Medicine

## 2024-01-27 DIAGNOSIS — I5020 Unspecified systolic (congestive) heart failure: Secondary | ICD-10-CM | POA: Insufficient documentation

## 2024-01-27 LAB — BRAIN NATRIURETIC PEPTIDE: B Natriuretic Peptide: 1032 pg/mL — ABNORMAL HIGH (ref 0.0–100.0)

## 2024-02-11 ENCOUNTER — Encounter: Payer: Self-pay | Admitting: Ophthalmology

## 2024-02-12 NOTE — Discharge Instructions (Signed)

## 2024-02-16 ENCOUNTER — Encounter
Admission: RE | Disposition: A | Discharge status: Home / Self Care | Payer: Self-pay | Source: Home / Self Care | Attending: Ophthalmology

## 2024-02-16 ENCOUNTER — Other Ambulatory Visit: Payer: Self-pay

## 2024-02-16 ENCOUNTER — Ambulatory Visit
Admission: RE | Admit: 2024-02-16 | Discharge: 2024-02-16 | Disposition: A | Discharge status: Home / Self Care | Attending: Ophthalmology | Admitting: Ophthalmology

## 2024-02-16 ENCOUNTER — Ambulatory Visit: Payer: Self-pay | Admitting: Anesthesiology

## 2024-02-16 ENCOUNTER — Encounter: Payer: Self-pay | Admitting: Ophthalmology

## 2024-02-16 DIAGNOSIS — H2511 Age-related nuclear cataract, right eye: Secondary | ICD-10-CM | POA: Insufficient documentation

## 2024-02-16 DIAGNOSIS — Z7984 Long term (current) use of oral hypoglycemic drugs: Secondary | ICD-10-CM | POA: Diagnosis not present

## 2024-02-16 DIAGNOSIS — E1136 Type 2 diabetes mellitus with diabetic cataract: Secondary | ICD-10-CM | POA: Insufficient documentation

## 2024-02-16 HISTORY — PX: CATARACT EXTRACTION W/PHACO: SHX586

## 2024-02-16 HISTORY — DX: Atherosclerotic heart disease of native coronary artery without angina pectoris: I25.10

## 2024-02-16 HISTORY — DX: Pulmonary hypertension, unspecified: I27.20

## 2024-02-16 HISTORY — DX: Other ill-defined heart diseases: I51.89

## 2024-02-16 HISTORY — DX: Unspecified systolic (congestive) heart failure: I50.20

## 2024-02-16 LAB — GLUCOSE, CAPILLARY: Glucose-Capillary: 86 mg/dL (ref 70–99)

## 2024-02-16 SURGERY — PHACOEMULSIFICATION, CATARACT, WITH IOL INSERTION
Anesthesia: Monitor Anesthesia Care | Site: Eye | Laterality: Right

## 2024-02-16 MED ORDER — LIDOCAINE HCL (PF) 2 % IJ SOLN
INTRAOCULAR | Status: DC | PRN
  Administered 2024-02-16: 2 mL

## 2024-02-16 MED ORDER — SIGHTPATH DOSE#1 NA CHONDROIT SULF-NA HYALURON 40-17 MG/ML IO SOLN
INTRAOCULAR | Status: DC | PRN
  Administered 2024-02-16: 1 mL via INTRAOCULAR

## 2024-02-16 MED ORDER — EPHEDRINE SULFATE (PRESSORS) 50 MG/ML IJ SOLN
INTRAMUSCULAR | Status: DC | PRN
  Administered 2024-02-16: 5 mg via INTRAVENOUS

## 2024-02-16 MED ORDER — MIDAZOLAM HCL 2 MG/2ML IJ SOLN
INTRAMUSCULAR | Status: AC
  Filled 2024-02-16: qty 2

## 2024-02-16 MED ORDER — FENTANYL CITRATE (PF) 100 MCG/2ML IJ SOLN
INTRAMUSCULAR | Status: DC | PRN
  Administered 2024-02-16: 50 ug via INTRAVENOUS

## 2024-02-16 MED ORDER — TETRACAINE HCL 0.5 % OP SOLN
OPHTHALMIC | Status: AC
  Filled 2024-02-16: qty 4

## 2024-02-16 MED ORDER — MOXIFLOXACIN HCL 0.5 % OP SOLN
OPHTHALMIC | Status: DC | PRN
  Administered 2024-02-16: .2 mL via OPHTHALMIC

## 2024-02-16 MED ORDER — TETRACAINE HCL 0.5 % OP SOLN
1.0000 [drp] | OPHTHALMIC | Status: DC | PRN
  Administered 2024-02-16 (×3): 1 [drp] via OPHTHALMIC

## 2024-02-16 MED ORDER — MIDAZOLAM HCL 2 MG/2ML IJ SOLN
INTRAMUSCULAR | Status: DC | PRN
  Administered 2024-02-16: 1 mg via INTRAVENOUS

## 2024-02-16 MED ORDER — ARMC OPHTHALMIC DILATING DROPS
1.0000 | OPHTHALMIC | Status: DC | PRN
  Administered 2024-02-16 (×3): 1 via OPHTHALMIC

## 2024-02-16 MED ORDER — SIGHTPATH DOSE#1 BSS IO SOLN
INTRAOCULAR | Status: DC | PRN
  Administered 2024-02-16: 15 mL via INTRAOCULAR

## 2024-02-16 MED ORDER — SIGHTPATH DOSE#1 BSS IO SOLN
INTRAOCULAR | Status: DC | PRN
  Administered 2024-02-16: 61 mL via OPHTHALMIC

## 2024-02-16 MED ORDER — FENTANYL CITRATE (PF) 100 MCG/2ML IJ SOLN
INTRAMUSCULAR | Status: AC
  Filled 2024-02-16: qty 2

## 2024-02-16 MED ORDER — BRIMONIDINE TARTRATE-TIMOLOL 0.2-0.5 % OP SOLN
OPHTHALMIC | Status: DC | PRN
  Administered 2024-02-16: 1 [drp] via OPHTHALMIC

## 2024-02-16 MED ORDER — ARMC OPHTHALMIC DILATING DROPS
OPHTHALMIC | Status: AC
  Filled 2024-02-16: qty 0.5

## 2024-02-16 SURGICAL SUPPLY — 12 items
CATARACT SUITE SIGHTPATH (MISCELLANEOUS) ×1 IMPLANT
CYSTOTOME ANGL RVRS SHRT 25G (CUTTER) ×1 IMPLANT
CYSTOTOME ANGL RVRS SHRT 25GA (CUTTER) ×1 IMPLANT
FEE CATARACT SUITE SIGHTPATH (MISCELLANEOUS) ×1 IMPLANT
GLOVE BIOGEL PI IND STRL 8 (GLOVE) ×1 IMPLANT
GLOVE SURG LX STRL 8.0 MICRO (GLOVE) ×1 IMPLANT
GLOVE SURG PROTEXIS BL SZ6.5 (GLOVE) ×1 IMPLANT
GLOVE SURG SYN 6.5 PF PI BL (GLOVE) ×1 IMPLANT
LENS IOL TECNIS EYHANCE 21.5 (Intraocular Lens) IMPLANT
NDL FILTER BLUNT 18X1 1/2 (NEEDLE) ×1 IMPLANT
NEEDLE FILTER BLUNT 18X1 1/2 (NEEDLE) ×1 IMPLANT
SYR 3ML LL SCALE MARK (SYRINGE) ×1 IMPLANT

## 2024-02-16 NOTE — Anesthesia Postprocedure Evaluation (Signed)
 Anesthesia Post Note  Patient: Theresa [name]  Procedure(s) Performed: PHACOEMULSIFICATION, CATARACT, WITH IOL INSERTION 12.76 01:03.3 (Right: Eye)  Patient location during evaluation: PACU Anesthesia Type: MAC Level of consciousness: awake and alert Pain management: pain level controlled Vital Signs Assessment: post-procedure vital signs reviewed and stable Respiratory status: spontaneous breathing, nonlabored ventilation, respiratory function stable and patient connected to nasal cannula oxygen Cardiovascular status: stable and blood pressure returned to baseline Postop Assessment: no apparent nausea or vomiting Anesthetic complications: no   No notable events documented.   Last Vitals:  Vitals:   02/16/24 0940 02/16/24 0943  BP: (!) 116/55 (!) 118/59  Pulse: 70 77  Resp: 16 17  Temp:  36.5 C  SpO2: 100% 100%    Last Pain:  Vitals:   02/16/24 0943  TempSrc:   PainSc: 0-No pain                 Emilie Harden

## 2024-02-16 NOTE — H&P (Signed)
 [facility]   Primary Care Physician:  Eartha Gold, MD Ophthalmologist: Dr. Merrell Abate  Pre-Procedure History & Physical: HPI:  Theresa [name] is a 88 y.o. female here for cataract surgery.   Past Medical History:  Diagnosis Date   Anemia    B12 deficiency    CAD (coronary artery disease)    Diabetes mellitus    pt. does not take meds   DJD (degenerative joint disease) of knee    Grade I diastolic dysfunction    Heart failure with reduced ejection fraction (HCC)    Hemorrhoids    Hyperlipidemia    refuses treatment   Hypertension    Hypokalemia    Mild pulmonary hypertension (HCC)    Osteoarthritis    Osteopenia    Systemic lupus erythematosus (HCC)    Thyroid  nodule    Trochanteric bursitis of right hip    Vitamin D  deficiency     Past Surgical History:  Procedure Laterality Date   COLONOSCOPY     COLONOSCOPY WITH PROPOFOL  N/A 03/25/2023   Procedure: COLONOSCOPY WITH PROPOFOL ;  Surgeon: Conrado Delay, DO;  Location: [facility] ENDOSCOPY;  Service: General;  Laterality: N/A;   RIGHT/LEFT HEART CATH AND CORONARY ANGIOGRAPHY Bilateral 01/01/2024   Procedure: RIGHT/LEFT HEART CATH AND CORONARY ANGIOGRAPHY;  Surgeon: Antonette Batters, MD;  Location: [facility] INVASIVE CV LAB;  Service: Cardiovascular;  Laterality: Bilateral;   TONSILLECTOMY     TONSILLECTOMY     TUBAL LIGATION      Prior to Admission medications   Medication Sig Start Date End Date Taking? Authorizing Provider  ALFALFA PO Take 10 tablets by mouth See admin instructions. Take 3 tablets for two meals and 4 tablet for one meal   Yes [provider]  ascorbic acid  (VITAMIN C ) 500 MG tablet Take 500 mg by mouth daily.   Yes [provider]  Boswellia Serrata (BOSWELLIA PO) Take 1,200 mg by mouth daily.   Yes [provider]  Cholecalciferol (VITAMIN D3) 50 MCG (2000 UT) capsule Take 2,000 Units by mouth 3 (three) times a week.   Yes [provider]  dapagliflozin  propanediol (FARXIGA) 10 MG TABS tablet Take 1 tablet by mouth daily. 12/15/23 12/14/24 Yes [provider]  Fe Bisgly-Vit C-Vit B12-FA (GENTLE IRON  PO) Take 25 mg by mouth daily.   Yes [provider]  MAGNESIUM CITRATE PO Take 400 mg by mouth daily.   Yes [provider]  metoprolol  succinate (TOPROL -XL) 50 MG 24 hr tablet Take 50 mg by mouth daily. 12/22/23 12/21/24 Yes [provider]  Multiple Vitamins-Minerals (MULTIVITAL) tablet Take 1 tablet by mouth daily.  W/ vit K   Yes [provider]  OVER THE COUNTER MEDICATION Take 5 capsules by mouth See admin instructions. OsteoMatrix by Audrea Blender Take 2 capsule for two meal and 1 capsule at one meal   Yes [provider]  polyvinyl alcohol  (LIQUIFILM TEARS) 1.4 % ophthalmic solution Place 1 drop into both eyes as needed for dry eyes. 03/30/23  Yes Althia Atlas, MD  Potassium Chloride  ER 20 MEQ TBCR Take 20 mEq by mouth at bedtime.   Yes [provider]  sacubitril-valsartan (ENTRESTO) 24-26 MG Take 1 tablet by mouth every 12 (twelve) hours. 12/15/23  Yes [provider]  spironolactone (ALDACTONE) 25 MG tablet Take 1 tablet by mouth daily. 12/08/23 12/07/24 Yes [provider]  torsemide (DEMADEX) 20 MG tablet Take 20 mg by mouth daily as needed (Swelling). 12/15/23 12/14/24 Yes [provider]    Allergies as of 02/03/2024 - Review Complete 01/01/2024  Allergen Reaction Noted   Sitagliptin Nausea Only 03/18/2023    Family History  Problem Relation Age of Onset   Prostate cancer Brother    Diabetes Brother    Anesthesia problems Mother     Social History   Socioeconomic History   Marital status: Divorced    Spouse name: Not on file   Number of children: Not on file   Years of education: Not on file   Highest education level: Not on file  Occupational History   Not on file  Tobacco Use   Smoking status: Never   Smokeless tobacco: Never  Vaping Use    Vaping status: Never Used  Substance and Sexual Activity   Alcohol  use: No   Drug use: No   Sexual activity: Not on file  Other Topics Concern   Not on file  Social History Narrative   Not on file   Social Drivers of Health   Financial Resource Strain: Low Risk  (01/13/2024)   Received from [facility]   Overall Financial Resource Strain (CARDIA)    Difficulty of Paying Living Expenses: Not hard at all  Food Insecurity: No Food Insecurity (01/13/2024)   Received from [facility]   Hunger Vital Sign    Worried About Running Out of Food in the Last Year: Never true    Ran Out of Food in the Last Year: Never true  Transportation Needs: No Transportation Needs (01/13/2024)   Received from [facility] - Transportation    In the past 12 months, has lack of transportation kept you from medical appointments or from getting medications?: No    Lack of Transportation (Non-Medical): No  Physical Activity: Insufficiently Active (01/05/2021)   Received from [facility] visits prior to 12/06/2022., [facility] visits prior to 12/06/2022.   Exercise Vital Sign    Days of Exercise per Week: 4 days    Minutes of Exercise per Session: 30 min  Stress: No Stress Concern Present (01/05/2021)   Received from [facility] visits prior to 12/06/2022., [facility] visits prior to 12/06/2022.   [employer] of Occupational Health - Occupational Stress Questionnaire    Feeling of Stress : Not at all  Social Connections: Moderately Isolated (01/05/2021)   Received from [facility] visits prior to 12/06/2022., [facility] visits prior to 12/06/2022.   Social Advertising account executive [NHANES]    Frequency of Communication with Friends and Family: More than three times a week    Frequency of Social Gatherings with Friends and  Family: Once a week    Attends Religious Services: More than 4 times per year    Active Member of [employer] or Organizations: No    Attends Engineer, structural: Not on file    Marital Status: Divorced  Intimate Partner Violence: Not At Risk (03/28/2023)   Humiliation, Afraid, Rape, and Kick questionnaire    Fear of Current or Ex-Partner: No    Emotionally Abused: No    Physically Abused: No    Sexually Abused: No    Review of Systems: See HPI, otherwise negative ROS  Physical Exam: BP 119/75   Pulse 84   Temp 97.6 F (36.4 C) (Temporal)   Resp 18   Ht 5\' 2"  (1.575 m)  Wt 65.8 kg   SpO2 99%   BMI 26.52 kg/m  General:   Alert, cooperative. Head:  Normocephalic and atraumatic. Respiratory:  Normal work of breathing. Cardiovascular:  NAD  Impression/Plan: Theresa [name] is here for cataract surgery.  Risks, benefits, limitations, and alternatives regarding cataract surgery have been reviewed with the patient.  Questions have been answered.  All parties agreeable.   Clair Crews, MD  02/16/2024, 9:17 AM

## 2024-02-16 NOTE — Anesthesia Preprocedure Evaluation (Addendum)
 Anesthesia Evaluation  Patient identified by MRN, date of birth, ID band Patient awake    Reviewed: Allergy & Precautions, H&P , NPO status , Patient's Chart, lab work & pertinent test results  Airway Mallampati: I  TM Distance: >3 FB Neck ROM: Full    Dental no notable dental hx. (+) Caps   Pulmonary neg pulmonary ROS   Pulmonary exam normal breath sounds clear to auscultation       Cardiovascular hypertension, negative cardio ROS Normal cardiovascular exam Rhythm:Regular Rate:Normal  03-28-23 1. Left ventricular ejection fraction, by estimation, is 40 to 45%. The  left ventricle has mildly decreased function. The left ventricle  demonstrates global hypokinesis. Left ventricular diastolic parameters are  consistent with Grade I diastolic  dysfunction (impaired relaxation).   2. Right ventricular systolic function is low normal. The right  ventricular size is mildly enlarged.   3. The mitral valve is grossly normal. Trivial mitral valve  regurgitation.   4. The aortic valve is grossly normal. Aortic valve regurgitation is  trivial.   01-01-24 left and right heart cath:   Dist RCA lesion is 50% stenosed.   Prox RCA lesion is 30% stenosed.   1st Mrg lesion is 50% stenosed.   There is severe left ventricular systolic dysfunction.   LV end diastolic pressure is mildly elevated.   The left ventricular ejection fraction is 25-35% by visual estimate.   Hemodynamic findings consistent with mild pulmonary hypertension.   No indication for antiplatelet therapy at this time .   Conclusion Outpatient right and left heart cath because of heart failure Right brachial vein right radial artery access site   Right heart cath Mean wedge 11 mean PA 35   Left heart cath   Left Ventricular function severely depressed globally at around 30 to 35% left ventricular enlargement       Neuro/Psych negative neurological ROS   negative psych ROS   GI/Hepatic negative GI ROS, Neg liver ROS,,,  Endo/Other  negative endocrine ROSdiabetes    Renal/GU negative Renal ROS  negative genitourinary   Musculoskeletal negative musculoskeletal ROS (+) Arthritis ,    Abdominal   Peds negative pediatric ROS (+)  Hematology negative hematology ROS (+) Blood dyscrasia, anemia   Anesthesia Other Findings Hypertension  Diabetes mellitus DJD (degenerative joint disease) of knee Hyperlipidemia Hemorrhoids  Anemia B12 deficiency  Hypokalemia Trochanteric bursitis of right hip Systemic lupus erythematosus (HCC) Vitamin D  deficiency  Osteopenia Thyroid  nodule  Osteoarthritis Ejection fraction 25-35% Grade I diastolic dysfunction CAD    Reproductive/Obstetrics negative OB ROS                             Anesthesia Physical Anesthesia Plan  ASA: 4  Anesthesia Plan: MAC   Post-op Pain Management:    Induction: Intravenous  PONV Risk Score and Plan:   Airway Management Planned: Natural Airway and Nasal Cannula  Additional Equipment:   Intra-op Plan:   Post-operative Plan:   Informed Consent: I have reviewed the patients History and Physical, chart, labs and discussed the procedure including the risks, benefits and alternatives for the proposed anesthesia with the patient or authorized representative who has indicated his/her understanding and acceptance.     Dental Advisory Given  Plan Discussed with: Anesthesiologist, CRNA and Surgeon  Anesthesia Plan Comments: (Patient consented for risks of anesthesia including but not limited to:  - adverse reactions to medications - damage to eyes, teeth, lips or other oral  mucosa - nerve damage due to positioning  - sore throat or hoarseness - Damage to heart, brain, nerves, lungs, other parts of body or loss of life  Patient voiced understanding and assent.)        Anesthesia Quick Evaluation

## 2024-02-16 NOTE — Op Note (Signed)
 PREOPERATIVE DIAGNOSIS:  Nuclear sclerotic cataract of the right eye.   POSTOPERATIVE DIAGNOSIS:  Right Eye Cataract   OPERATIVE PROCEDURE:ORPROCALL@   SURGEON:  Clair Crews, MD.   ANESTHESIA:  Anesthesiologist: Emilie Harden, MD CRNA: Jahoo, Sonia, CRNA  1.      Managed anesthesia care. 2.      0.68ml of Shugarcaine was instilled in the eye following the paracentesis.   COMPLICATIONS:  None.   TECHNIQUE:   Stop and chop   DESCRIPTION OF PROCEDURE:  The patient was examined and consented in the preoperative holding area where the aforementioned topical anesthesia was applied to the right eye and then brought back to the Operating Room where the right eye was prepped and draped in the usual sterile ophthalmic fashion and a lid speculum was placed. A paracentesis was created with the side port blade and the anterior chamber was filled with viscoelastic. A near clear corneal incision was performed with the steel keratome. A continuous curvilinear capsulorrhexis was performed with a cystotome followed by the capsulorrhexis forceps. Hydrodissection and hydrodelineation were carried out with BSS on a blunt cannula. The lens was removed in a stop and chop  technique and the remaining cortical material was removed with the irrigation-aspiration handpiece. The capsular bag was inflated with viscoelastic and the Technis [id number]  lens was placed in the capsular bag without complication. The remaining viscoelastic was removed from the eye with the irrigation-aspiration handpiece. The wounds were hydrated. The anterior chamber was flushed with BSS and the eye was inflated to physiologic pressure. 0.1ml of Vigamox was placed in the anterior chamber. The wounds were found to be water tight. The eye was dressed with Combigan. The patient was given protective glasses to wear throughout the day and a shield with which to sleep tonight. The patient was also given drops with which to begin a drop regimen today  and will follow-up with me in one day. Implant Name Type Inv. Item Serial No. Manufacturer Lot No. LRB No. Used Action  LENS IOL TECNIS EYHANCE 21.5 - [id number] Intraocular Lens LENS IOL TECNIS EYHANCE 21.5 [id number] [employer]  Right 1 Implanted   Procedure(s): PHACOEMULSIFICATION, CATARACT, WITH IOL INSERTION 12.76 01:03.3 (Right)  Electronically signed: Clair Crews 02/16/2024 9:38 AM

## 2024-02-16 NOTE — Transfer of Care (Signed)
 Immediate Anesthesia Transfer of Care Note  Patient: Theresa [name]  Procedure(s) Performed: PHACOEMULSIFICATION, CATARACT, WITH IOL INSERTION 12.76 01:03.3 (Right: Eye)  Patient Location: PACU  Anesthesia Type: MAC  Level of Consciousness: awake, alert  and patient cooperative  Airway and Oxygen Therapy: Patient Spontanous Breathing and Patient connected to supplemental oxygen  Post-op Assessment: Post-op Vital signs reviewed, Patient's Cardiovascular Status Stable, Respiratory Function Stable, Patent Airway and No signs of Nausea or vomiting  Post-op Vital Signs: Reviewed and stable  Complications: No notable events documented.

## 2024-02-22 NOTE — Anesthesia Preprocedure Evaluation (Signed)
 Anesthesia Evaluation  Patient identified by MRN, date of birth, ID band Patient awake    Reviewed: Allergy & Precautions, H&P , NPO status , Patient's Chart, lab work & pertinent test results  Airway Mallampati: I  TM Distance: >3 FB Neck ROM: Full    Dental no notable dental hx. (+) Caps   Pulmonary neg pulmonary ROS   Pulmonary exam normal breath sounds clear to auscultation       Cardiovascular hypertension, + CAD  Normal cardiovascular exam Rhythm:Regular Rate:Normal  03-28-23 1. Left ventricular ejection fraction, by estimation, is 40 to 45%. The  left ventricle has mildly decreased function. The left ventricle  demonstrates global hypokinesis. Left ventricular diastolic parameters are  consistent with Grade I diastolic  dysfunction (impaired relaxation).   2. Right ventricular systolic function is low normal. The right  ventricular size is mildly enlarged.   3. The mitral valve is grossly normal. Trivial mitral valve  regurgitation.   4. The aortic valve is grossly normal. Aortic valve regurgitation is  trivial.    01-01-24 left and right heart cath:   Dist RCA lesion is 50% stenosed.   Prox RCA lesion is 30% stenosed.   1st Mrg lesion is 50% stenosed.   There is severe left ventricular systolic dysfunction.   LV end diastolic pressure is mildly elevated.   The left ventricular ejection fraction is 25-35% by visual estimate.   Hemodynamic findings consistent with mild pulmonary hypertension.   No indication for antiplatelet therapy at this time .   Conclusion Outpatient right and left heart cath because of heart failure Right brachial vein right radial artery access site    Neuro/Psych negative neurological ROS  negative psych ROS   GI/Hepatic negative GI ROS, Neg liver ROS,,,  Endo/Other  diabetes    Renal/GU negative Renal ROS  negative genitourinary   Musculoskeletal negative musculoskeletal  ROS (+) Arthritis ,    Abdominal   Peds negative pediatric ROS (+)  Hematology negative hematology ROS (+) Blood dyscrasia, anemia   Anesthesia Other Findings Previous cataract surgery 02-16-24 Dr. Aldo Amble   Hypertension             Diabetes mellitus DJD (degenerative joint disease) of knee Hyperlipidemia Hemorrhoids             Anemia B12 deficiency        Hypokalemia Trochanteric bursitis of right hipSystemic lupus erythematosus (HCC) Vitamin D  deficiency    Osteopenia Thyroid  nodule             Osteoarthritis Ejection fraction 25-35% Grade I diastolic dysfunction CAD   Reproductive/Obstetrics negative OB ROS                             Anesthesia Physical Anesthesia Plan  ASA: 4  Anesthesia Plan: MAC   Post-op Pain Management:    Induction: Intravenous  PONV Risk Score and Plan:   Airway Management Planned: Natural Airway and Nasal Cannula  Additional Equipment:   Intra-op Plan:   Post-operative Plan:   Informed Consent: I have reviewed the patients History and Physical, chart, labs and discussed the procedure including the risks, benefits and alternatives for the proposed anesthesia with the patient or authorized representative who has indicated his/her understanding and acceptance.     Dental Advisory Given  Plan Discussed with: Anesthesiologist, CRNA and Surgeon  Anesthesia Plan Comments: (Patient consented for risks of anesthesia including but not limited to:  - adverse reactions  to medications - damage to eyes, teeth, lips or other oral mucosa - nerve damage due to positioning  - sore throat or hoarseness - Damage to heart, brain, nerves, lungs, other parts of body or loss of life  Patient voiced understanding and assent.)        Anesthesia Quick Evaluation

## 2024-03-01 ENCOUNTER — Other Ambulatory Visit: Payer: Self-pay

## 2024-03-01 ENCOUNTER — Ambulatory Visit: Payer: Self-pay | Admitting: Anesthesiology

## 2024-03-01 ENCOUNTER — Encounter
Admission: RE | Disposition: A | Discharge status: Home / Self Care | Payer: Self-pay | Source: Home / Self Care | Attending: Ophthalmology

## 2024-03-01 ENCOUNTER — Encounter: Payer: Self-pay | Admitting: Ophthalmology

## 2024-03-01 ENCOUNTER — Ambulatory Visit
Admission: RE | Admit: 2024-03-01 | Discharge: 2024-03-01 | Disposition: A | Discharge status: Home / Self Care | Attending: Ophthalmology | Admitting: Ophthalmology

## 2024-03-01 DIAGNOSIS — E1136 Type 2 diabetes mellitus with diabetic cataract: Secondary | ICD-10-CM | POA: Diagnosis not present

## 2024-03-01 DIAGNOSIS — M1990 Unspecified osteoarthritis, unspecified site: Secondary | ICD-10-CM | POA: Insufficient documentation

## 2024-03-01 DIAGNOSIS — Z9841 Cataract extraction status, right eye: Secondary | ICD-10-CM | POA: Diagnosis not present

## 2024-03-01 DIAGNOSIS — I10 Essential (primary) hypertension: Secondary | ICD-10-CM | POA: Diagnosis not present

## 2024-03-01 DIAGNOSIS — H2512 Age-related nuclear cataract, left eye: Secondary | ICD-10-CM | POA: Insufficient documentation

## 2024-03-01 DIAGNOSIS — Z961 Presence of intraocular lens: Secondary | ICD-10-CM | POA: Diagnosis not present

## 2024-03-01 DIAGNOSIS — I2510 Atherosclerotic heart disease of native coronary artery without angina pectoris: Secondary | ICD-10-CM | POA: Insufficient documentation

## 2024-03-01 DIAGNOSIS — D649 Anemia, unspecified: Secondary | ICD-10-CM | POA: Insufficient documentation

## 2024-03-01 HISTORY — PX: CATARACT EXTRACTION W/PHACO: SHX586

## 2024-03-01 SURGERY — PHACOEMULSIFICATION, CATARACT, WITH IOL INSERTION
Anesthesia: Monitor Anesthesia Care | Site: Eye | Laterality: Left

## 2024-03-01 MED ORDER — EPHEDRINE SULFATE (PRESSORS) 50 MG/ML IJ SOLN
INTRAMUSCULAR | Status: DC | PRN
Start: 2024-03-01 — End: 2024-03-01
  Administered 2024-03-01: 10 mg via INTRAVENOUS

## 2024-03-01 MED ORDER — MIDAZOLAM HCL 2 MG/2ML IJ SOLN
INTRAMUSCULAR | Status: DC | PRN
  Administered 2024-03-01: .5 mg via INTRAVENOUS

## 2024-03-01 MED ORDER — LIDOCAINE HCL (PF) 2 % IJ SOLN
INTRAOCULAR | Status: DC | PRN
  Administered 2024-03-01: 2 mL

## 2024-03-01 MED ORDER — SIGHTPATH DOSE#1 BSS IO SOLN
INTRAOCULAR | Status: DC | PRN
  Administered 2024-03-01: 58 mL via OPHTHALMIC

## 2024-03-01 MED ORDER — FENTANYL CITRATE (PF) 100 MCG/2ML IJ SOLN
INTRAMUSCULAR | Status: DC | PRN
  Administered 2024-03-01: 25 ug via INTRAVENOUS

## 2024-03-01 MED ORDER — MOXIFLOXACIN HCL 0.5 % OP SOLN
OPHTHALMIC | Status: DC | PRN
  Administered 2024-03-01: .2 mL via OPHTHALMIC

## 2024-03-01 MED ORDER — SODIUM CHLORIDE 0.9% FLUSH
INTRAVENOUS | Status: DC | PRN
  Administered 2024-03-01: 10 mL via INTRAVENOUS

## 2024-03-01 MED ORDER — SIGHTPATH DOSE#1 BSS IO SOLN
INTRAOCULAR | Status: DC | PRN
  Administered 2024-03-01: 15 mL via INTRAOCULAR

## 2024-03-01 MED ORDER — MIDAZOLAM HCL 2 MG/2ML IJ SOLN
INTRAMUSCULAR | Status: AC
  Filled 2024-03-01: qty 2

## 2024-03-01 MED ORDER — FENTANYL CITRATE (PF) 100 MCG/2ML IJ SOLN
INTRAMUSCULAR | Status: AC
Start: 2024-03-01 — End: ?
  Filled 2024-03-01: qty 2

## 2024-03-01 MED ORDER — TETRACAINE HCL 0.5 % OP SOLN
OPHTHALMIC | Status: AC
  Filled 2024-03-01: qty 4

## 2024-03-01 MED ORDER — ARMC OPHTHALMIC DILATING DROPS
OPHTHALMIC | Status: AC
  Filled 2024-03-01: qty 0.5

## 2024-03-01 MED ORDER — SIGHTPATH DOSE#1 NA CHONDROIT SULF-NA HYALURON 40-17 MG/ML IO SOLN
INTRAOCULAR | Status: DC | PRN
  Administered 2024-03-01: 1 mL via INTRAOCULAR

## 2024-03-01 MED ORDER — BRIMONIDINE TARTRATE-TIMOLOL 0.2-0.5 % OP SOLN
OPHTHALMIC | Status: DC | PRN
  Administered 2024-03-01: 1 [drp] via OPHTHALMIC

## 2024-03-01 MED ORDER — ARMC OPHTHALMIC DILATING DROPS
1.0000 | OPHTHALMIC | Status: DC | PRN
  Administered 2024-03-01 (×3): 1 via OPHTHALMIC

## 2024-03-01 MED ORDER — TETRACAINE HCL 0.5 % OP SOLN
1.0000 [drp] | OPHTHALMIC | Status: DC | PRN
  Administered 2024-03-01 (×3): 1 [drp] via OPHTHALMIC

## 2024-03-01 SURGICAL SUPPLY — 12 items
CATARACT SUITE SIGHTPATH (MISCELLANEOUS) ×1 IMPLANT
CYSTOTOME ANGL RVRS SHRT 25G (CUTTER) ×1 IMPLANT
CYSTOTOME ANGL RVRS SHRT 25GA (CUTTER) ×1 IMPLANT
FEE CATARACT SUITE SIGHTPATH (MISCELLANEOUS) ×1 IMPLANT
GLOVE BIOGEL PI IND STRL 8 (GLOVE) ×1 IMPLANT
GLOVE SURG LX STRL 8.0 MICRO (GLOVE) ×1 IMPLANT
GLOVE SURG PROTEXIS BL SZ6.5 (GLOVE) ×1 IMPLANT
GLOVE SURG SYN 6.5 PF PI BL (GLOVE) ×1 IMPLANT
LENS IOL TECNIS EYHANCE 22.0 (Intraocular Lens) IMPLANT
NDL FILTER BLUNT 18X1 1/2 (NEEDLE) ×1 IMPLANT
NEEDLE FILTER BLUNT 18X1 1/2 (NEEDLE) ×1 IMPLANT
SYR 3ML LL SCALE MARK (SYRINGE) ×1 IMPLANT

## 2024-03-01 NOTE — Op Note (Signed)
 PREOPERATIVE DIAGNOSIS:  Nuclear sclerotic cataract of the left eye.   POSTOPERATIVE DIAGNOSIS:  Nuclear sclerotic cataract of the left eye.   OPERATIVE PROCEDURE:ORPROCALL@   SURGEON:  Theresa Crews, MD.   ANESTHESIA:  Anesthesiologist: Emilie Harden, MD CRNA: Karan Osgood, CRNA  1.      Managed anesthesia care. 2.     0.19ml of Shugarcaine was instilled following the paracentesis   COMPLICATIONS:  None.   TECHNIQUE:   Stop and chop   DESCRIPTION OF PROCEDURE:  The patient was examined and consented in the preoperative holding area where the aforementioned topical anesthesia was applied to the left eye and then brought back to the Operating Room where the left eye was prepped and draped in the usual sterile ophthalmic fashion and a lid speculum was placed. A paracentesis was created with the side port blade and the anterior chamber was filled with viscoelastic. A near clear corneal incision was performed with the steel keratome. A continuous curvilinear capsulorrhexis was performed with a cystotome followed by the capsulorrhexis forceps. Hydrodissection and hydrodelineation were carried out with BSS on a blunt cannula. The lens was removed in a stop and chop  technique and the remaining cortical material was removed with the irrigation-aspiration handpiece. The capsular bag was inflated with viscoelastic and the Technis [id number] lens was placed in the capsular bag without complication. The remaining viscoelastic was removed from the eye with the irrigation-aspiration handpiece. The wounds were hydrated. The anterior chamber was flushed with BSS and the eye was inflated to physiologic pressure. 0.27ml Vigamox  was placed in the anterior chamber. The wounds were found to be water tight. The eye was dressed with Combigan . The patient was given protective glasses to wear throughout the day and a shield with which to sleep tonight. The patient was also given drops with which to begin a drop regimen  today and will follow-up with me in one day. Implant Name Type Inv. Item Serial No. Manufacturer Lot No. LRB No. Used Action  LENS IOL TECNIS EYHANCE 22.0 - [id number] Intraocular Lens LENS IOL TECNIS EYHANCE 22.0 [id number] [employer]  Left 1 Implanted    Procedure(s): PHACOEMULSIFICATION, CATARACT, WITH IOL INSERTION 11.88 01:11.8 (Left)  Electronically signed: Clair [name] 03/01/2024 9:37 AM

## 2024-03-01 NOTE — Transfer of Care (Signed)
 Immediate Anesthesia Transfer of Care Note  Patient: Theresa [name]  Procedure(s) Performed: PHACOEMULSIFICATION, CATARACT, WITH IOL INSERTION 11.88 01:11.8 (Left: Eye)  Patient Location: PACU  Anesthesia Type:MAC  Level of Consciousness: awake and alert   Airway & Oxygen Therapy: Patient Spontanous Breathing  Post-op Assessment: Report given to RN and Post -op Vital signs reviewed and stable  Post vital signs: Reviewed and stable  Last Vitals:  Vitals Value Taken Time  BP 115/55 03/01/24 0938  Temp 36.2 C 03/01/24 0938  Pulse 66 03/01/24 0940  Resp 12 03/01/24 0940  SpO2 100 % 03/01/24 0940  Vitals shown include unfiled device data.  Last Pain:  Vitals:   03/01/24 0938  TempSrc:   PainSc: 0-No pain         Complications: No notable events documented.

## 2024-03-01 NOTE — H&P (Signed)
 [facility]   Primary Care Physician:  Eartha Gold, MD Ophthalmologist: Dr.Poriflio  Pre-Procedure History & Physical: HPI:  Theresa [name] is a 88 y.o. female here for cataract surgery.   Past Medical History:  Diagnosis Date   Anemia    B12 deficiency    CAD (coronary artery disease)    Diabetes mellitus    pt. does not take meds   DJD (degenerative joint disease) of knee    Grade I diastolic dysfunction    Heart failure with reduced ejection fraction (HCC)    Hemorrhoids    Hyperlipidemia    refuses treatment   Hypertension    Hypokalemia    Mild pulmonary hypertension (HCC)    Osteoarthritis    Osteopenia    Systemic lupus erythematosus (HCC)    Thyroid  nodule    Trochanteric bursitis of right hip    Vitamin D  deficiency     Past Surgical History:  Procedure Laterality Date   CATARACT EXTRACTION W/PHACO Right 02/16/2024   Procedure: PHACOEMULSIFICATION, CATARACT, WITH IOL INSERTION 12.76 01:03.3;  Surgeon: Clair Crews, MD;  [facility];  Service: Ophthalmology;  Laterality: Right;   COLONOSCOPY     COLONOSCOPY WITH PROPOFOL  N/A 03/25/2023   Procedure: COLONOSCOPY WITH PROPOFOL ;  Surgeon: Conrado Delay, DO;  Location: [facility] ENDOSCOPY;  Service: General;  Laterality: N/A;   RIGHT/LEFT HEART CATH AND CORONARY ANGIOGRAPHY Bilateral 01/01/2024   Procedure: RIGHT/LEFT HEART CATH AND CORONARY ANGIOGRAPHY;  Surgeon: Antonette Batters, MD;  Location: [facility] INVASIVE CV LAB;  Service: Cardiovascular;  Laterality: Bilateral;   TONSILLECTOMY     TONSILLECTOMY     TUBAL LIGATION      Prior to Admission medications   Medication Sig Start Date End Date Taking? Authorizing Provider  ALFALFA PO Take 10 tablets by mouth See admin instructions. Take 3 tablets for two meals and 4 tablet for one meal   Yes [provider]  ascorbic acid  (VITAMIN C ) 500 MG tablet Take 500 mg by mouth daily.   Yes [provider]  Boswellia Serrata  (BOSWELLIA PO) Take 1,200 mg by mouth daily.   Yes [provider]  Cholecalciferol (VITAMIN D3) 50 MCG (2000 UT) capsule Take 2,000 Units by mouth 3 (three) times a week.   Yes [provider]  dapagliflozin propanediol (FARXIGA) 10 MG TABS tablet Take 1 tablet by mouth daily. 12/15/23 12/14/24 Yes [provider]  Fe Bisgly-Vit C-Vit B12-FA (GENTLE IRON  PO) Take 25 mg by mouth daily.   Yes [provider]  MAGNESIUM CITRATE PO Take 400 mg by mouth daily.   Yes [provider]  metoprolol  succinate (TOPROL -XL) 50 MG 24 hr tablet Take 50 mg by mouth daily. 12/22/23 12/21/24 Yes [provider]  Multiple Vitamins-Minerals (MULTIVITAL) tablet Take 1 tablet by mouth daily.  W/ vit K   Yes [provider]  OVER THE COUNTER MEDICATION Take 5 capsules by mouth See admin instructions. OsteoMatrix by Audrea Blender Take 2 capsule for two meal and 1 capsule at one meal   Yes [provider]  polyvinyl alcohol  (LIQUIFILM TEARS) 1.4 % ophthalmic solution Place 1 drop into both eyes as needed for dry eyes. 03/30/23  Yes Althia Atlas, MD  Potassium Chloride  ER 20 MEQ TBCR Take 20 mEq by mouth at bedtime.   Yes [provider]  sacubitril-valsartan (ENTRESTO) 24-26 MG Take 1 tablet by mouth every 12 (twelve) hours. 12/15/23  Yes [provider]  spironolactone (ALDACTONE) 25 MG tablet  Take 1 tablet by mouth daily. 12/08/23 12/07/24 Yes [provider]  torsemide (DEMADEX) 20 MG tablet Take 20 mg by mouth daily as needed (Swelling). 12/15/23 12/14/24  [provider]    Allergies as of 02/03/2024 - Review Complete 01/01/2024  Allergen Reaction Noted   Sitagliptin Nausea Only 03/18/2023    Family History  Problem Relation Age of Onset   Prostate cancer Brother    Diabetes Brother    Anesthesia problems Mother     Social History   Socioeconomic History   Marital status: Divorced    Spouse name: Not on file    Number of children: Not on file   Years of education: Not on file   Highest education level: Not on file  Occupational History   Not on file  Tobacco Use   Smoking status: Never   Smokeless tobacco: Never  Vaping Use   Vaping status: Never Used  Substance and Sexual Activity   Alcohol  use: No   Drug use: No   Sexual activity: Not on file  Other Topics Concern   Not on file  Social History Narrative   Not on file   Social Drivers of Health   Financial Resource Strain: Low Risk  (01/13/2024)   Received from [facility]   Overall Financial Resource Strain (CARDIA)    Difficulty of Paying Living Expenses: Not hard at all  Food Insecurity: No Food Insecurity (01/13/2024)   Received from [facility]   Hunger Vital Sign    Worried About Running Out of Food in the Last Year: Never true    Ran Out of Food in the Last Year: Never true  Transportation Needs: No Transportation Needs (01/13/2024)   Received from [facility] - [facility]    In the past 12 months, has lack of transportation kept you from medical appointments or from getting medications?: No    Lack of Transportation (Non-Medical): No  Physical Activity: Insufficiently Active (01/05/2021)   Received from [facility] visits prior to 12/06/2022., [facility] visits prior to 12/06/2022.   Exercise Vital Sign    Days of Exercise per Week: 4 days    Minutes of Exercise per Session: 30 min  Stress: No Stress Concern Present (01/05/2021)   Received from [facility] visits prior to 12/06/2022., [facility] visits prior to 12/06/2022.   [employer] of Occupational Health - Occupational Stress Questionnaire    Feeling of Stress : Not at all  Social Connections: Moderately Isolated (01/05/2021)   [facility] 12[facility]06/2022., [facility]  [facility] visits prior to 12/06/2022.   Social Advertising account executive [NHANES]    Frequency of Communication with Friends and Family: More than three times a week    Frequency of Social Gatherings with Friends and Family: Once a week    Attends Religious Services: More than 4 times per year    Active Member of [employer] or Organizations: No    Attends Banker Meetings: Not on file    Marital Status: Divorced  Intimate Partner Violence: Not At Risk (03/28/2023)   Humiliation, Afraid, Rape, and Kick questionnaire    Fear of Current or Ex-Partner: No    Emotionally Abused: No    Physically Abused: No    Sexually Abused: No    Review of Systems: See HPI, otherwise  negative ROS  Physical Exam: BP (!) 105/56   Temp 98.5 F (36.9 C) (Temporal)   Ht 5' 2.01" (1.575 m)   Wt 65.1 kg   SpO2 100%   BMI 26.26 kg/m  General:   Alert, cooperative. Head:  Normocephalic and atraumatic. Respiratory:  Normal work of breathing. Cardiovascular:  NAD  Impression/Plan: Theresa [name] is here for cataract surgery.  Risks, benefits, limitations, and alternatives regarding cataract surgery have been reviewed with the patient.  Questions have been answered.  All parties agreeable.   Clair Crews, MD  03/01/2024, 9:17 AM

## 2024-03-01 NOTE — Anesthesia Postprocedure Evaluation (Signed)
 Anesthesia Post Note  Patient: JAVON HUPFER  Procedure(s) Performed: PHACOEMULSIFICATION, CATARACT, WITH IOL INSERTION 11.88 01:11.8 (Left: Eye)  Patient location during evaluation: PACU Anesthesia Type: MAC Level of consciousness: awake and alert Pain management: pain level controlled Vital Signs Assessment: post-procedure vital signs reviewed and stable Respiratory status: spontaneous breathing, nonlabored ventilation, respiratory function stable and patient connected to nasal cannula oxygen Cardiovascular status: stable and blood pressure returned to baseline Postop Assessment: no apparent nausea or vomiting Anesthetic complications: no   No notable events documented.   Last Vitals:  Vitals:   03/01/24 0938 03/01/24 0943  BP: (!) 115/55 (!) 117/51  Pulse: 65 61  Resp: 10 14  Temp: (!) 36.2 C (!) 36.2 C  SpO2: 100% 100%    Last Pain:  Vitals:   03/01/24 0943  TempSrc:   PainSc: 0-No pain                 Emilie Harden

## 2024-03-02 ENCOUNTER — Encounter: Payer: Self-pay | Admitting: Ophthalmology

## 2024-03-15 ENCOUNTER — Encounter

## 2024-03-28 ENCOUNTER — Ambulatory Visit
Admission: RE | Admit: 2024-03-28 | Discharge: 2024-03-28 | Disposition: A | Discharge status: Home / Self Care | Source: Ambulatory Visit | Attending: Infectious Diseases | Admitting: Infectious Diseases

## 2024-03-28 DIAGNOSIS — Z1231 Encounter for screening mammogram for malignant neoplasm of breast: Secondary | ICD-10-CM | POA: Insufficient documentation

## 2024-04-12 IMAGING — MG MM DIGITAL SCREENING BILAT W/ TOMO AND CAD
6 of 10 series · 6 of 30 positions shown · non-contrast
Comparison: Previous exam(s).

ACR Breast Density Category a: The breast tissue is almost entirely
fatty.

CLINICAL DATA: Screening.

EXAM:
DIGITAL SCREENING BILATERAL MAMMOGRAM WITH TOMOSYNTHESIS AND CAD
TECHNIQUE: Bilateral screening digital craniocaudal and mediolateral oblique
mammograms were obtained. Bilateral screening digital breast
tomosynthesis was performed. The images were evaluated with
computer-aided detection.

[R MLO synth-2D]
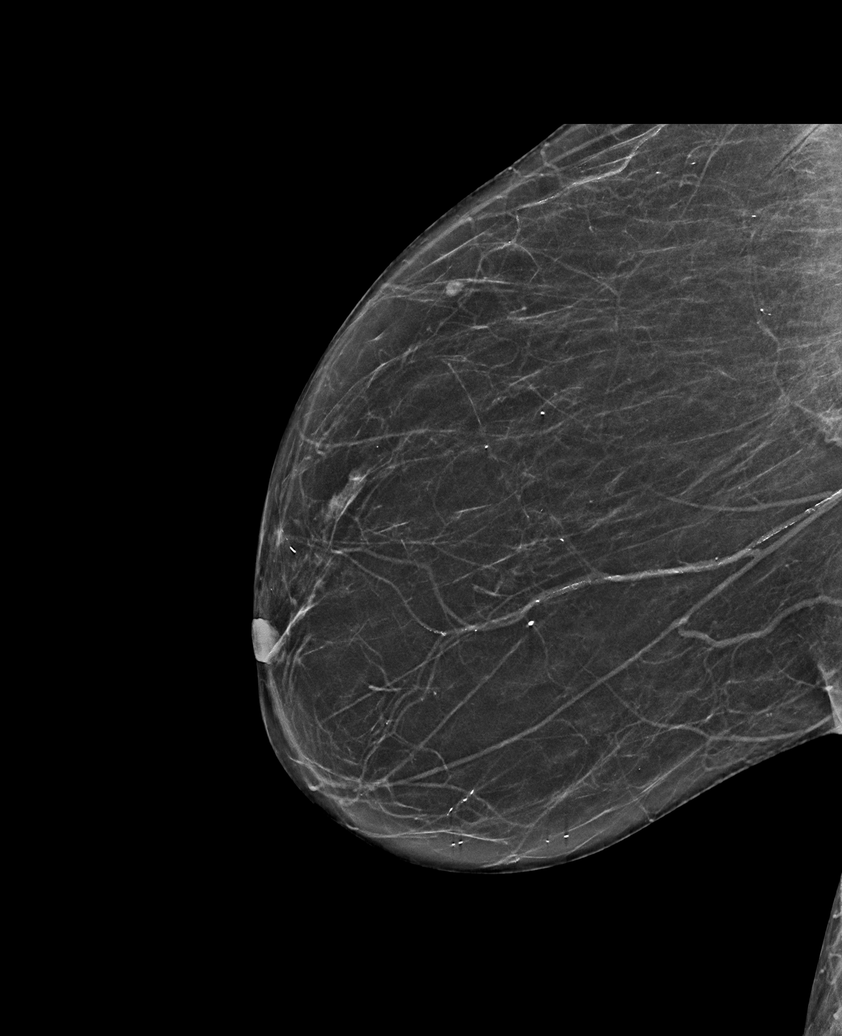

[L MLO synth-2D (1 of 2)]
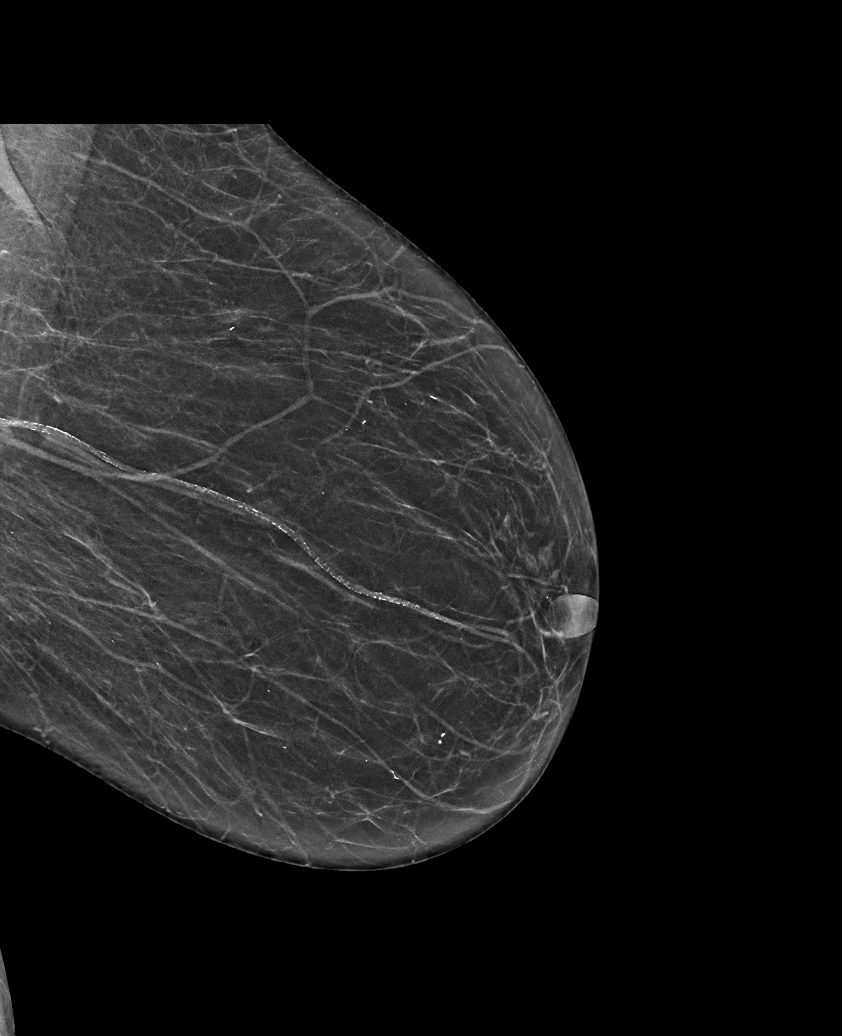

[R CC synth-2D]
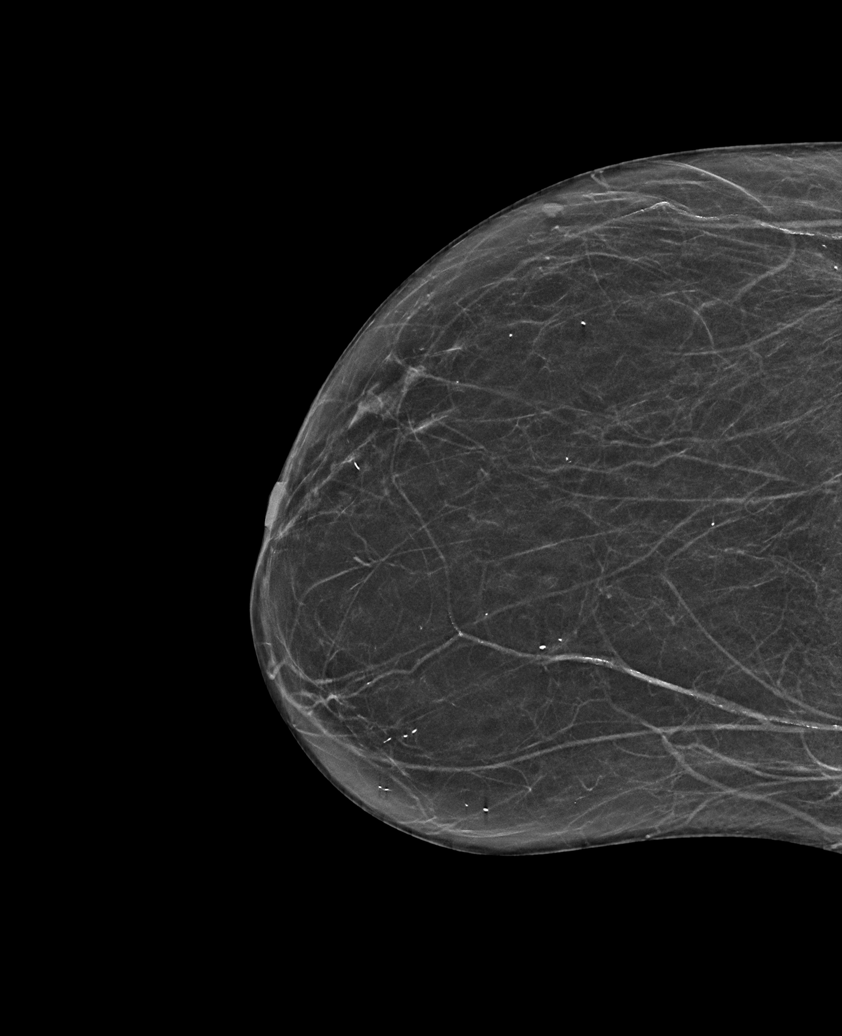

[L CC synth-2D]
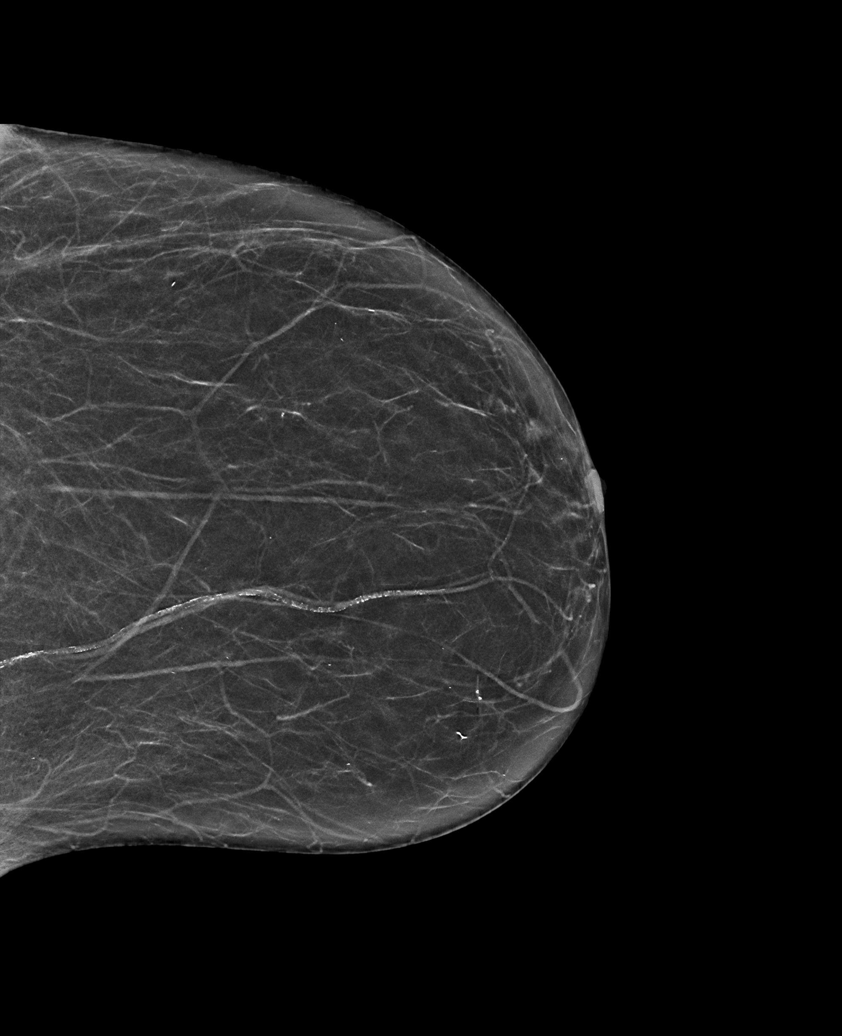

[L MLO synth-2D (2 of 2)]
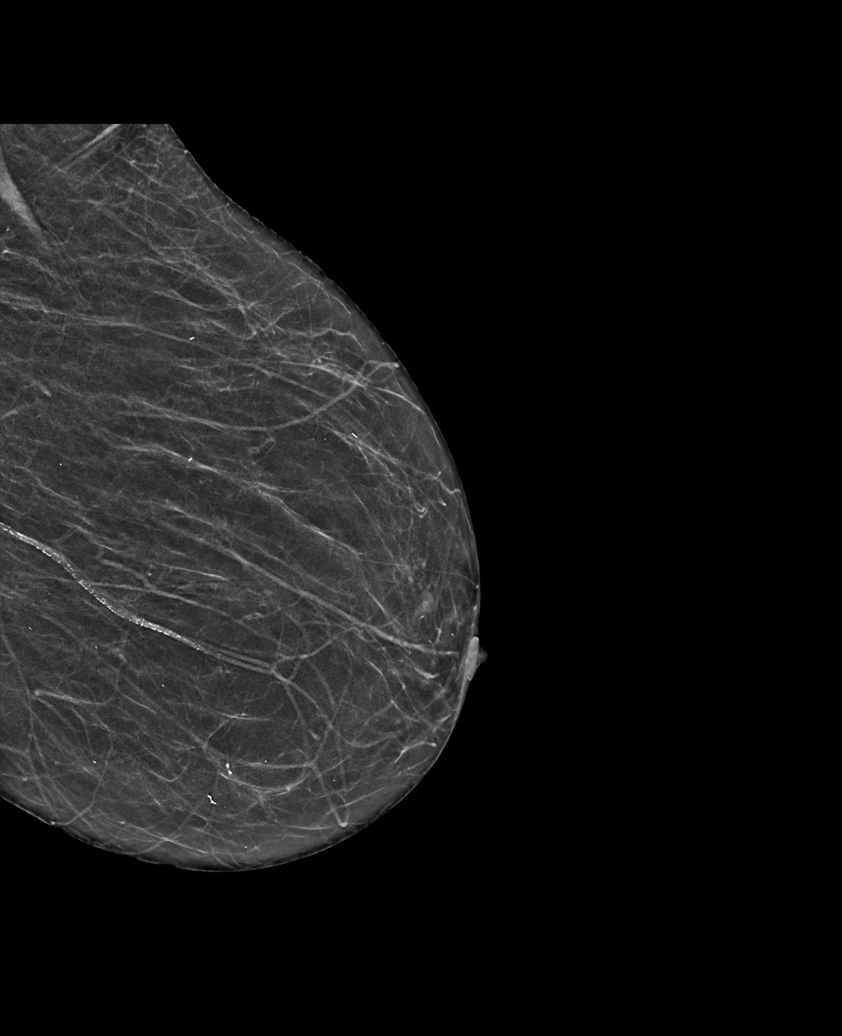

[L MLO tomo · tomo slice 27/54.0]
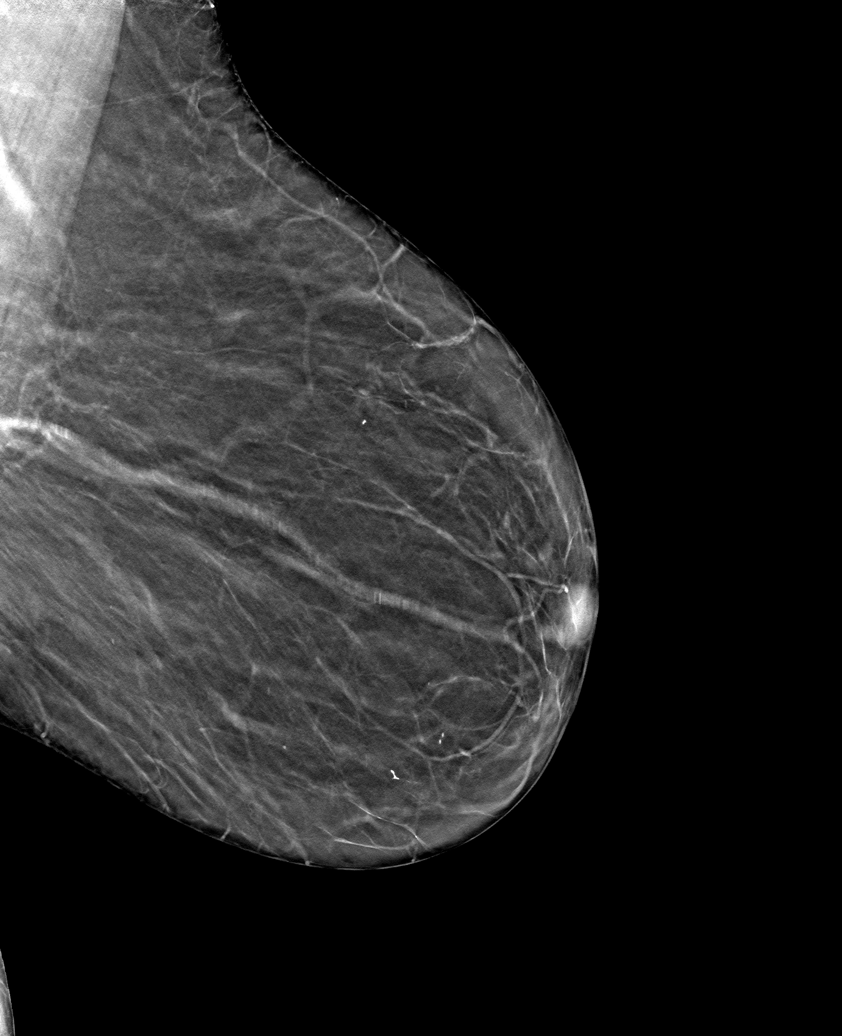

[6 of 30 positions shown; findings below may reference images not displayed]

FINDINGS: There are no findings suspicious for malignancy.
IMPRESSION: No mammographic evidence of malignancy. A result letter of this
screening mammogram will be mailed directly to the patient.

RECOMMENDATION:
Screening mammogram in one year. (Code:0E-3-N98)

BI-RADS CATEGORY  1: Negative.

## 2024-08-17 ENCOUNTER — Other Ambulatory Visit: Payer: Self-pay | Admitting: Infectious Diseases

## 2024-08-17 DIAGNOSIS — R7989 Other specified abnormal findings of blood chemistry: Secondary | ICD-10-CM

## 2024-08-18 ENCOUNTER — Ambulatory Visit
Admission: RE | Admit: 2024-08-18 | Discharge: 2024-08-18 | Disposition: A | Discharge status: Home / Self Care | Source: Ambulatory Visit | Attending: Infectious Diseases | Admitting: Infectious Diseases

## 2024-08-18 DIAGNOSIS — R7989 Other specified abnormal findings of blood chemistry: Secondary | ICD-10-CM | POA: Insufficient documentation

## 2024-10-17 ENCOUNTER — Other Ambulatory Visit
Admission: RE | Admit: 2024-10-17 | Discharge: 2024-10-17 | Disposition: A | Discharge status: Home / Self Care | Source: Ambulatory Visit | Attending: Internal Medicine | Admitting: Internal Medicine

## 2024-10-17 DIAGNOSIS — I5032 Chronic diastolic (congestive) heart failure: Secondary | ICD-10-CM | POA: Diagnosis present

## 2024-10-17 DIAGNOSIS — R0602 Shortness of breath: Secondary | ICD-10-CM | POA: Insufficient documentation

## 2024-10-17 DIAGNOSIS — I5020 Unspecified systolic (congestive) heart failure: Secondary | ICD-10-CM | POA: Diagnosis present

## 2024-10-17 LAB — TROPONIN T, HIGH SENSITIVITY: Troponin T High Sensitivity: 22 ng/L — ABNORMAL HIGH (ref 0–19)

## 2024-10-18 LAB — PRO BRAIN NATRIURETIC PEPTIDE: Pro Brain Natriuretic Peptide: 10814 pg/mL — ABNORMAL HIGH
